# Patient Record
Sex: Female | Born: 1990
Health system: Southern US, Community
[De-identification: ages and names within clinical notes are randomized; demographics above are authoritative.]

## PROBLEM LIST (undated history)

## (undated) DIAGNOSIS — O039 Complete or unspecified spontaneous abortion without complication: Secondary | ICD-10-CM

## (undated) DIAGNOSIS — N12 Tubulo-interstitial nephritis, not specified as acute or chronic: Secondary | ICD-10-CM

## (undated) DIAGNOSIS — F329 Major depressive disorder, single episode, unspecified: Secondary | ICD-10-CM

## (undated) DIAGNOSIS — F32A Depression, unspecified: Secondary | ICD-10-CM

## (undated) DIAGNOSIS — F909 Attention-deficit hyperactivity disorder, unspecified type: Secondary | ICD-10-CM

## (undated) DIAGNOSIS — F419 Anxiety disorder, unspecified: Secondary | ICD-10-CM

## (undated) HISTORY — PX: CHOLECYSTECTOMY: SHX55

## (undated) HISTORY — PX: WISDOM TOOTH EXTRACTION: SHX21

## (undated) HISTORY — PX: OVARIAN CYST SURGERY: SHX726

## (undated) HISTORY — PX: KNEE ARTHROSCOPY: SUR90

---

## 2010-10-01 ENCOUNTER — Emergency Department (HOSPITAL_BASED_OUTPATIENT_CLINIC_OR_DEPARTMENT_OTHER)
Admission: EM | Admit: 2010-10-01 | Discharge: 2010-10-01 | Disposition: A | Discharge status: Home / Self Care | Payer: Managed Care, Other (non HMO) | Attending: Emergency Medicine | Admitting: Emergency Medicine

## 2010-10-01 DIAGNOSIS — N390 Urinary tract infection, site not specified: Secondary | ICD-10-CM | POA: Insufficient documentation

## 2010-10-01 LAB — URINE MICROSCOPIC-ADD ON

## 2010-10-01 LAB — URINALYSIS, ROUTINE W REFLEX MICROSCOPIC
Hgb urine dipstick: NEGATIVE
Specific Gravity, Urine: 1.015 (ref 1.005–1.030)
Urobilinogen, UA: 4 mg/dL — ABNORMAL HIGH (ref 0.0–1.0)

## 2011-06-03 ENCOUNTER — Emergency Department (INDEPENDENT_AMBULATORY_CARE_PROVIDER_SITE_OTHER): Payer: Managed Care, Other (non HMO)

## 2011-06-03 ENCOUNTER — Encounter: Payer: Self-pay | Admitting: *Deleted

## 2011-06-03 ENCOUNTER — Emergency Department (HOSPITAL_BASED_OUTPATIENT_CLINIC_OR_DEPARTMENT_OTHER)
Admission: EM | Admit: 2011-06-03 | Discharge: 2011-06-04 | Disposition: A | Discharge status: Home / Self Care | Payer: Managed Care, Other (non HMO) | Attending: Emergency Medicine | Admitting: Emergency Medicine

## 2011-06-03 DIAGNOSIS — R05 Cough: Secondary | ICD-10-CM

## 2011-06-03 DIAGNOSIS — R0602 Shortness of breath: Secondary | ICD-10-CM

## 2011-06-03 DIAGNOSIS — R1110 Vomiting, unspecified: Secondary | ICD-10-CM

## 2011-06-03 DIAGNOSIS — R109 Unspecified abdominal pain: Secondary | ICD-10-CM

## 2011-06-03 DIAGNOSIS — R112 Nausea with vomiting, unspecified: Secondary | ICD-10-CM | POA: Insufficient documentation

## 2011-06-03 DIAGNOSIS — R1031 Right lower quadrant pain: Secondary | ICD-10-CM

## 2011-06-03 DIAGNOSIS — R1012 Left upper quadrant pain: Secondary | ICD-10-CM | POA: Insufficient documentation

## 2011-06-03 DIAGNOSIS — E860 Dehydration: Secondary | ICD-10-CM | POA: Insufficient documentation

## 2011-06-03 DIAGNOSIS — N83209 Unspecified ovarian cyst, unspecified side: Secondary | ICD-10-CM

## 2011-06-03 DIAGNOSIS — D72829 Elevated white blood cell count, unspecified: Secondary | ICD-10-CM

## 2011-06-03 DIAGNOSIS — R509 Fever, unspecified: Secondary | ICD-10-CM

## 2011-06-03 HISTORY — DX: Attention-deficit hyperactivity disorder, unspecified type: F90.9

## 2011-06-03 LAB — CBC
MCH: 29 pg (ref 26.0–34.0)
MCHC: 34.1 g/dL (ref 30.0–36.0)
Platelets: 289 10*3/uL (ref 150–400)
RBC: 5 MIL/uL (ref 3.87–5.11)
RDW: 12.7 % (ref 11.5–15.5)

## 2011-06-03 LAB — URINALYSIS, ROUTINE W REFLEX MICROSCOPIC
Ketones, ur: 15 mg/dL — AB
Nitrite: NEGATIVE
Protein, ur: NEGATIVE mg/dL
Urobilinogen, UA: 0.2 mg/dL (ref 0.0–1.0)
pH: 7.5 (ref 5.0–8.0)

## 2011-06-03 LAB — COMPREHENSIVE METABOLIC PANEL
ALT: 21 U/L (ref 0–35)
Albumin: 4.5 g/dL (ref 3.5–5.2)
Alkaline Phosphatase: 67 U/L (ref 39–117)
Calcium: 9.8 mg/dL (ref 8.4–10.5)
Potassium: 3.2 mEq/L — ABNORMAL LOW (ref 3.5–5.1)
Sodium: 139 mEq/L (ref 135–145)
Total Protein: 7.6 g/dL (ref 6.0–8.3)

## 2011-06-03 LAB — DIFFERENTIAL
Basophils Absolute: 0 10*3/uL (ref 0.0–0.1)
Basophils Relative: 0 % (ref 0–1)
Eosinophils Absolute: 0 10*3/uL (ref 0.0–0.7)
Neutrophils Relative %: 93 % — ABNORMAL HIGH (ref 43–77)

## 2011-06-03 LAB — URINE MICROSCOPIC-ADD ON

## 2011-06-03 MED ORDER — ONDANSETRON HCL 4 MG/2ML IJ SOLN
4.0000 mg | Freq: Once | INTRAMUSCULAR | Status: AC
  Administered 2011-06-03: 4 mg via INTRAVENOUS
  Filled 2011-06-03: qty 2

## 2011-06-03 MED ORDER — HYDROMORPHONE HCL PF 1 MG/ML IJ SOLN
0.5000 mg | Freq: Once | INTRAMUSCULAR | Status: AC
  Administered 2011-06-03: 0.5 mg via INTRAVENOUS
  Filled 2011-06-03: qty 1

## 2011-06-03 MED ORDER — SODIUM CHLORIDE 0.9 % IV BOLUS (SEPSIS)
1000.0000 mL | Freq: Once | INTRAVENOUS | Status: AC
  Administered 2011-06-03: 1000 mL via INTRAVENOUS

## 2011-06-03 MED ORDER — ACETAMINOPHEN 500 MG PO TABS
ORAL_TABLET | ORAL | Status: AC
  Administered 2011-06-03: 1000 mg via ORAL
  Filled 2011-06-03: qty 2

## 2011-06-03 MED ORDER — ACETAMINOPHEN 500 MG PO TABS
1000.0000 mg | ORAL_TABLET | Freq: Once | ORAL | Status: AC
  Administered 2011-06-03: 1000 mg via ORAL

## 2011-06-03 MED ORDER — IOHEXOL 300 MG/ML  SOLN
100.0000 mL | Freq: Once | INTRAMUSCULAR | Status: AC | PRN
  Administered 2011-06-03: 100 mL via INTRAVENOUS

## 2011-06-03 NOTE — ED Provider Notes (Signed)
History     CSN: [id number] Arrival date & time: 06/03/2011  5:17 PM   First MD Initiated Contact with Patient 06/03/11 1741      Chief Complaint  Patient presents with  . Abdominal Pain    (Consider location/radiation/quality/duration/timing/severity/associated sxs/prior treatment) Patient is a 20 y.o. female presenting with abdominal pain. The history is provided by the patient.  Abdominal Pain The primary symptoms of the illness include abdominal pain, nausea and vomiting. The primary symptoms of the illness do not include fever, diarrhea or dysuria. The onset of the illness was gradual.  The abdominal pain radiates to the LUQ. The abdominal pain is relieved by nothing.  Symptoms associated with the illness do not include chills.    Past Medical History  Diagnosis Date  . ADHD (attention deficit hyperactivity disorder)     Past Surgical History  Procedure Date  . Cholecystectomy   . Wisdom tooth extraction   . Ovarian cyst surgery     History reviewed. No pertinent family history.  History  Substance Use Topics  . Smoking status: Never Smoker   . Smokeless tobacco: Not on file  . Alcohol Use: Yes    OB History    Grav Para Term Preterm Abortions TAB SAB Ect Mult Living                  Review of Systems  Constitutional: Negative for fever and chills.  HENT: Negative.   Respiratory: Negative.  Negative for cough.   Cardiovascular: Negative.  Negative for chest pain.  Gastrointestinal: Positive for nausea, vomiting and abdominal pain. Negative for diarrhea.  Genitourinary: Negative for dysuria.  Musculoskeletal: Negative.   Skin: Negative.   Neurological: Negative.     Allergies  Morphine and related and Strawberry  Home Medications   Current Outpatient Rx  Name Route Sig Dispense Refill  . AMPHETAMINE-DEXTROAMPHETAMINE 30 MG PO CP24 Oral Take 30 mg by mouth every morning.      Marland Kitchen PROMETHAZINE HCL 25 MG PO TABS Oral Take 25 mg by mouth every 6  (six) hours as needed. For nausea      . VENLAFAXINE HCL 37.5 MG PO TABS Oral Take 37.5 mg by mouth daily.      Marland Kitchen HYDROCODONE-ACETAMINOPHEN 5-325 MG PO TABS Oral Take 1 tablet by mouth every 4 (four) hours as needed for pain. 6 tablet 0  . ONDANSETRON HCL 4 MG PO TABS Oral Take 1 tablet (4 mg total) by mouth every 6 (six) hours. 12 tablet 0    BP 102/31  Pulse 104  Temp(Src) 98.1 F (36.7 C) (Oral)  Resp 18  Ht 5\' 1"  (1.549 m)  Wt 145 lb (65.772 kg)  BMI 27.40 kg/m2  SpO2 97%  LMP 05/18/2011  Physical Exam  Constitutional: She appears well-developed and well-nourished.  HENT:  Head: Normocephalic.  Neck: Normal range of motion. Neck supple.  Cardiovascular: Normal rate and regular rhythm.   Pulmonary/Chest: Effort normal and breath sounds normal.  Abdominal: Soft. Bowel sounds are normal. There is no rebound and no guarding.    Musculoskeletal: Normal range of motion.  Neurological: She is alert. No cranial nerve deficit.  Skin: Skin is warm and dry. No rash noted.  Psychiatric: She has a normal mood and affect.    ED Course  Procedures (including critical care time)  Labs Reviewed  URINALYSIS, ROUTINE W REFLEX MICROSCOPIC - Abnormal; Notable for the following:    Ketones, ur 15 (*)    Leukocytes, UA TRACE (*)  All other components within normal limits  CBC - Abnormal; Notable for the following:    WBC 17.7 (*)    All other components within normal limits  DIFFERENTIAL - Abnormal; Notable for the following:    Neutrophils Relative 93 (*)    Neutro Abs 16.4 (*)    Lymphocytes Relative 3 (*)    Lymphs Abs 0.5 (*)    All other components within normal limits  COMPREHENSIVE METABOLIC PANEL - Abnormal; Notable for the following:    Potassium 3.2 (*)    Glucose, Bld 100 (*)    All other components within normal limits  URINE MICROSCOPIC-ADD ON - Abnormal; Notable for the following:    Squamous Epithelial / LPF FEW (*)    Bacteria, UA MANY (*)    All other  components within normal limits  PREGNANCY, URINE  LIPASE, BLOOD  MONONUCLEOSIS SCREEN   Results for orders placed during the hospital encounter of 06/03/11  URINALYSIS, ROUTINE W REFLEX MICROSCOPIC      Component Value Range   Color, Urine YELLOW  YELLOW    Appearance CLEAR  CLEAR    Specific Gravity, Urine 1.016  1.005 - 1.030    pH 7.5  5.0 - 8.0    Glucose, UA NEGATIVE  NEGATIVE (mg/dL)   Hgb urine dipstick NEGATIVE  NEGATIVE    Bilirubin Urine NEGATIVE  NEGATIVE    Ketones, ur 15 (*) NEGATIVE (mg/dL)   Protein, ur NEGATIVE  NEGATIVE (mg/dL)   Urobilinogen, UA 0.2  0.0 - 1.0 (mg/dL)   Nitrite NEGATIVE  NEGATIVE    Leukocytes, UA TRACE (*) NEGATIVE   PREGNANCY, URINE      Component Value Range   Preg Test, Ur NEGATIVE    CBC      Component Value Range   WBC 17.7 (*) 4.0 - 10.5 (K/uL)   RBC 5.00  3.87 - 5.11 (MIL/uL)   Hemoglobin 14.5  12.0 - 15.0 (g/dL)   HCT 96.0  45.4 - 09.8 (%)   MCV 85.0  78.0 - 100.0 (fL)   MCH 29.0  26.0 - 34.0 (pg)   MCHC 34.1  30.0 - 36.0 (g/dL)   RDW 11.9  14.7 - 82.9 (%)   Platelets 289  150 - 400 (K/uL)  DIFFERENTIAL      Component Value Range   Neutrophils Relative 93 (*) 43 - 77 (%)   Neutro Abs 16.4 (*) 1.7 - 7.7 (K/uL)   Lymphocytes Relative 3 (*) 12 - 46 (%)   Lymphs Abs 0.5 (*) 0.7 - 4.0 (K/uL)   Monocytes Relative 5  3 - 12 (%)   Monocytes Absolute 0.8  0.1 - 1.0 (K/uL)   Eosinophils Relative 0  0 - 5 (%)   Eosinophils Absolute 0.0  0.0 - 0.7 (K/uL)   Basophils Relative 0  0 - 1 (%)   Basophils Absolute 0.0  0.0 - 0.1 (K/uL)  COMPREHENSIVE METABOLIC PANEL      Component Value Range   Sodium 139  135 - 145 (mEq/L)   Potassium 3.2 (*) 3.5 - 5.1 (mEq/L)   Chloride 100  96 - 112 (mEq/L)   CO2 27  19 - 32 (mEq/L)   Glucose, Bld 100 (*) 70 - 99 (mg/dL)   BUN 10  6 - 23 (mg/dL)   Creatinine, Ser 5.62  0.50 - 1.10 (mg/dL)   Calcium 9.8  8.4 - 13.0 (mg/dL)   Total Protein 7.6  6.0 - 8.3 (g/dL)   Albumin 4.5  3.5 -  5.2 (g/dL)   AST  25  0 - 37 (U/L)   ALT 21  0 - 35 (U/L)   Alkaline Phosphatase 67  39 - 117 (U/L)   Total Bilirubin 0.4  0.3 - 1.2 (mg/dL)   GFR calc non Af Amer >90  >90 (mL/min)   GFR calc Af Amer >90  >90 (mL/min)  LIPASE, BLOOD      Component Value Range   Lipase 14  11 - 59 (U/L)  MONONUCLEOSIS SCREEN      Component Value Range   Mono Screen NEGATIVE  NEGATIVE   URINE MICROSCOPIC-ADD ON      Component Value Range   Squamous Epithelial / LPF FEW (*) RARE    WBC, UA 0-2  <3 (WBC/hpf)   Bacteria, UA MANY (*) RARE    Dg Chest 2 View  06/03/2011  *RADIOLOGY REPORT*  Clinical Data: Fever and cough and short of breath  CHEST - 2 VIEW  Comparison: None.  Findings: Heart size is normal and the vascularity is normal. Lungs are clear without pneumonia or effusion.  IMPRESSION: Negative  Original Report Authenticated By: Camelia Phenes, M.D.   Ct Abdomen Pelvis W Contrast  06/03/2011  *RADIOLOGY REPORT*  Clinical Data: Right lower quadrant pain.  Vomiting.  Leukocytosis.  CT ABDOMEN AND PELVIS WITH CONTRAST  Technique:  Multidetector CT imaging of the abdomen and pelvis was performed following the standard protocol during bolus administration of intravenous contrast.  Contrast: OMNIPAQUE IOHEXOL 300 MG/ML IV SOLN  Comparison: None.  Findings: Surgical clips seen from prior cholecystectomy.  The abdominal parenchymal organs are normal in appearance.  No evidence of hydronephrosis.  A simple appearing right ovarian cyst is seen measuring 3.2 x 2.6 cm.  Uterus and left adnexa are unremarkable.  A small amount of free fluid is seen in the pelvic cul-de-sac, which is likely physiologic.  Normal appendix is visualized.  No evidence of inflammatory process or dilated bowel loops.  IMPRESSION:  1.  3.2 cm right ovarian cyst and small amount of free fluid, most likely physiologic in a reproductive age female. 2.  No evidence of appendicitis.  Original Report Authenticated By: Danae Orleans, M.D.       1.  Abdominal  pain, other specified site   2. Dehydration       MDM  Re-eval:  Patient having pain again. No further vomiting, nausea controlled. She reports recent exposure to Mono with ST on initial illness. Cough as well in the beginning of symptoms. Will evaluate further and continue to observe.  22:30:  Patient continues to be tachycardic. Orthostatics checked and positive. Will continue to bolus another liter of fluids and observe.  00:05:  Patient's vital signs improved, no dizziness with standing. Walking to bathroom without difficulty. Feel she can be discharged home.    Medical screening examination/treatment/procedure(s) were performed by non-physician practitioner and as supervising physician I was immediately available for consultation/collaboration. Carleene Cooper III,M.D.     Rodena Medin, PA 06/04/11 0007  Carleene Cooper III, MD 06/04/11 [phone]

## 2011-06-03 NOTE — ED Notes (Signed)
Pt has called out requesting another dose of pain medication, primary rn and edp notified.

## 2011-06-03 NOTE — ED Notes (Signed)
Pt states that she has had abd pain and vomiting since last pm. Describes as cramping. Taking Phenergan without relief. Friend had mono.

## 2011-06-04 MED ORDER — KETOROLAC TROMETHAMINE 30 MG/ML IJ SOLN
30.0000 mg | Freq: Once | INTRAMUSCULAR | Status: AC
  Administered 2011-06-04: 30 mg via INTRAVENOUS
  Filled 2011-06-04: qty 1

## 2011-06-04 MED ORDER — ONDANSETRON HCL 4 MG PO TABS: 4.0000 mg | ORAL_TABLET | Freq: Four times a day (QID) | ORAL | Status: AC

## 2011-06-04 MED ORDER — HYDROCODONE-ACETAMINOPHEN 5-325 MG PO TABS: 1.0000 | ORAL_TABLET | ORAL | Status: AC | PRN

## 2011-09-04 ENCOUNTER — Encounter (HOSPITAL_BASED_OUTPATIENT_CLINIC_OR_DEPARTMENT_OTHER): Payer: Self-pay

## 2011-09-04 ENCOUNTER — Emergency Department (HOSPITAL_BASED_OUTPATIENT_CLINIC_OR_DEPARTMENT_OTHER)
Admission: EM | Admit: 2011-09-04 | Discharge: 2011-09-04 | Disposition: A | Discharge status: Home / Self Care | Payer: Managed Care, Other (non HMO) | Attending: Emergency Medicine | Admitting: Emergency Medicine

## 2011-09-04 DIAGNOSIS — N12 Tubulo-interstitial nephritis, not specified as acute or chronic: Secondary | ICD-10-CM

## 2011-09-04 DIAGNOSIS — F909 Attention-deficit hyperactivity disorder, unspecified type: Secondary | ICD-10-CM | POA: Insufficient documentation

## 2011-09-04 DIAGNOSIS — R350 Frequency of micturition: Secondary | ICD-10-CM | POA: Insufficient documentation

## 2011-09-04 HISTORY — DX: Tubulo-interstitial nephritis, not specified as acute or chronic: N12

## 2011-09-04 LAB — URINALYSIS, ROUTINE W REFLEX MICROSCOPIC
Bilirubin Urine: NEGATIVE
Glucose, UA: NEGATIVE mg/dL
Specific Gravity, Urine: 1.012 (ref 1.005–1.030)
pH: 6 (ref 5.0–8.0)

## 2011-09-04 LAB — CBC
HCT: 42.8 % (ref 36.0–46.0)
Hemoglobin: 14.3 g/dL (ref 12.0–15.0)
MCV: 87.9 fL (ref 78.0–100.0)
RBC: 4.87 MIL/uL (ref 3.87–5.11)
RDW: 13.6 % (ref 11.5–15.5)
WBC: 7.7 10*3/uL (ref 4.0–10.5)

## 2011-09-04 LAB — URINE MICROSCOPIC-ADD ON

## 2011-09-04 LAB — BASIC METABOLIC PANEL
CO2: 26 mEq/L (ref 19–32)
Chloride: 102 mEq/L (ref 96–112)
Creatinine, Ser: 0.7 mg/dL (ref 0.50–1.10)
Glucose, Bld: 100 mg/dL — ABNORMAL HIGH (ref 70–99)

## 2011-09-04 LAB — DIFFERENTIAL
Basophils Absolute: 0 10*3/uL (ref 0.0–0.1)
Lymphocytes Relative: 23 % (ref 12–46)
Lymphs Abs: 1.8 10*3/uL (ref 0.7–4.0)
Monocytes Absolute: 0.6 10*3/uL (ref 0.1–1.0)
Neutro Abs: 5.2 10*3/uL (ref 1.7–7.7)

## 2011-09-04 LAB — PREGNANCY, URINE: Preg Test, Ur: NEGATIVE

## 2011-09-04 MED ORDER — DEXTROSE 5 % IV SOLN
1.0000 g | Freq: Once | INTRAVENOUS | Status: AC
  Administered 2011-09-04: 1 g via INTRAVENOUS
  Filled 2011-09-04: qty 10

## 2011-09-04 MED ORDER — NITROFURANTOIN MONOHYD MACRO 100 MG PO CAPS: 100.0000 mg | ORAL_CAPSULE | Freq: Two times a day (BID) | ORAL | Status: DC

## 2011-09-04 MED ORDER — OXYCODONE-ACETAMINOPHEN 5-325 MG PO TABS: 2.0000 | ORAL_TABLET | ORAL | Status: DC | PRN

## 2011-09-04 MED ORDER — SODIUM CHLORIDE 0.9 % IV SOLN
Freq: Once | INTRAVENOUS | Status: AC
  Administered 2011-09-04: 11:00:00 via INTRAVENOUS

## 2011-09-04 NOTE — ED Notes (Signed)
Pt c/o bilateral flank pain onset 2 weeks ago.  Pt c/o dysuria, increased frequency, denies hematuria.  Pt states she has HX of same.

## 2011-09-04 NOTE — ED Provider Notes (Signed)
History     CSN: [id number]  Arrival date & time 09/04/11  0944   First MD Initiated Contact with Patient 09/04/11 [phone]      Chief Complaint  Patient presents with  . Flank Pain    (Consider location/radiation/quality/duration/timing/severity/associated sxs/prior treatment) Patient is a 21 y.o. female presenting with flank pain. The history is provided by the patient. No language interpreter was used.  Flank Pain This is a new problem. The current episode started 1 to 4 weeks ago. The problem occurs daily. The problem has been gradually worsening. Associated symptoms include urinary symptoms. Pertinent negatives include no fever, nausea, vomiting or weakness. The symptoms are aggravated by twisting and coughing. She has tried nothing for the symptoms.   patient reports bilateral flank pain today x5 days gradually getting worse. States that she's had earning with urination x2 weeks. She's had pyelonephritis in the past and states that this feels like. He denies fever hematuria pregnancy nausea vomiting or diarrhea. States that she does have a primary care provider at [facility] that she cannot get in there today. She is sexually active and uses another range for birth control but she states that she is inconsistent with that. Menstrual period was February 3 and was on time and regular. Past medical history of an ovarian cyst cholecystectomy and appendix removed. She is on Effexor and Adderall ADHD.  Past Medical History  Diagnosis Date  . ADHD (attention deficit hyperactivity disorder)   . Pyelonephritis     Past Surgical History  Procedure Date  . Cholecystectomy   . Wisdom tooth extraction   . Ovarian cyst surgery   . Appendectomy   . Knee arthroscopy     No family history on file.  History  Substance Use Topics  . Smoking status: Never Smoker   . Smokeless tobacco: Not on file  . Alcohol Use: Yes    OB History    Grav Para Term Preterm Abortions TAB SAB Ect Mult Living                   Review of Systems  Constitutional: Negative for fever.  Gastrointestinal: Negative for nausea and vomiting.  Genitourinary: Positive for urgency, frequency and flank pain. Negative for hematuria, vaginal bleeding, vaginal discharge and vaginal pain.  Neurological: Negative for dizziness, weakness and light-headedness.  All other systems reviewed and are negative.    Allergies  Morphine and related and Strawberry  Home Medications   Current Outpatient Rx  Name Route Sig Dispense Refill  . AMPHETAMINE-DEXTROAMPHET ER 30 MG PO CP24 Oral Take 30 mg by mouth every morning.      Marland Kitchen PROMETHAZINE HCL 25 MG PO TABS Oral Take 25 mg by mouth every 6 (six) hours as needed. For nausea      . VENLAFAXINE HCL 37.5 MG PO TABS Oral Take 37.5 mg by mouth daily.        BP 114/43  Pulse 74  Temp(Src) 98 F (36.7 C) (Oral)  Resp 16  Ht 5\' 1"  (1.549 m)  Wt 132 lb (59.875 kg)  BMI 24.94 kg/m2  SpO2 100%  LMP 08/20/2011  Physical Exam  Genitourinary:       Bilateral cva tenderness     ED Course  Procedures (including critical care time)   Labs Reviewed  URINALYSIS, ROUTINE W REFLEX MICROSCOPIC  PREGNANCY, URINE  CBC  DIFFERENTIAL   No results found.   No diagnosis found.    MDM   21 year old female with bilateral CVA  tenderness and a positive UTI past medical history of the same. Rocephin 1 g given in the ER. Patient was given a prescription for Macrobid and Percocet. She'll have a close followup with her PCP at [facility] of care. White count was normal afebrile no nausea vomiting.  Labs Reviewed  URINALYSIS, ROUTINE W REFLEX MICROSCOPIC - Abnormal; Notable for the following:    Color, Urine ORANGE (*) BIOCHEMICALS MAY BE AFFECTED BY COLOR   Hgb urine dipstick MODERATE (*)    Nitrite POSITIVE (*)    Leukocytes, UA TRACE (*)    All other components within normal limits  BASIC METABOLIC PANEL - Abnormal; Notable for the following:    Glucose, Bld 100  (*)    All other components within normal limits  URINE MICROSCOPIC-ADD ON - Abnormal; Notable for the following:    Bacteria, UA MANY (*)    All other components within normal limits  PREGNANCY, URINE  CBC  DIFFERENTIAL           Jethro Bastos, NP 09/04/11 1148

## 2011-09-04 NOTE — ED Provider Notes (Signed)
History/physical exam/procedure(s) were performed by non-physician practitioner and as supervising physician I was immediately available for consultation/collaboration. I have reviewed all notes and am in agreement with care and plan.   [name], MD 09/04/11 [phone]

## 2011-09-04 NOTE — Discharge Instructions (Signed)
Amy Lutz we gave you a dose of IV Rocephin in the ER today for possible pyelonephritis. Your CBC did not show an increase in your white blood cell which is good. You need to followup with [facility] within the next week to have your urine rechecked. Return to the ER for uncontrolled pain fever nausea and vomiting. Start antibiotics as soon as possible. Take Percocet for pain as needed but do not drive with this medication. You can also take ibuprofen for pain during the day while you're driving.   Pyelonephritis, Adult Pyelonephritis is a kidney infection. A kidney infection can happen quickly, or it can last for a long time. HOME CARE   Take your medicine (antibiotics) as told. Finish it even if you start to feel better.   Keep all doctor visits as told.   Drink enough fluids to keep your pee (urine) clear or pale yellow.   Only take medicine as told by your doctor.  GET HELP RIGHT AWAY IF:   You have a fever.   You cannot take your medicine or drink fluids as told.   You have chills and shaking.   You feel very weak or pass out (faint).   You do not feel better after 2 days.  MAKE SURE YOU:  Understand these instructions.   Will watch your condition.   Will get help right away if you are not doing well or get worse.  Document Released: 08/10/2004 Document Revised: 03/15/2011 Document Reviewed: 12/21/2010 [facility] Patient Information 2012 [place], [place].Urinary Tract Infection A urinary tract infection (UTI) is often caused by a germ (bacteria). A UTI is usually helped with medicine (antibiotics) that kills germs. Take all the medicine until it is gone. Do this even if you are feeling better. You are usually better in 7 to 10 days. HOME CARE  Drink enough water and fluids to keep your pee (urine) clear or pale yellow. Drink:  Cranberry juice.  Water.  Avoid:  Caffeine.  Tea.  Bubbly (carbonated) drinks.  Alcohol.  Only take medicine as told by your doctor.  To prevent  further infections:  Pee often.  After pooping (bowel movement), women should wipe from front to back. Use each tissue only once.  Pee before and after having sex (intercourse).  Ask your doctor when your test results will be ready. Make sure you follow up and get your test results.  GET HELP RIGHT AWAY IF:  There is very bad back pain or lower belly (abdominal) pain.  You get the chills.  You have a fever.  Your baby is older than 3 months with a rectal temperature of 102 F (38.9 C) or higher.  Your baby is 18 months old or younger with a rectal temperature of 100.4 F (38 C) or higher.  You feel sick to your stomach (nauseous) or throw up (vomit).  There is continued burning with peeing.  Your problems are not better in 3 days. Return sooner if you are getting worse.  MAKE SURE YOU:  Understand these instructions.  Will watch your condition.  Will get help right away if you are not doing well or get worse.  Document Released: 12/20/2007 Document Revised: 03/15/2011 Document Reviewed: 12/20/2007 [facility] Patient Information 2012 [place], [place].

## 2011-09-11 ENCOUNTER — Encounter (HOSPITAL_BASED_OUTPATIENT_CLINIC_OR_DEPARTMENT_OTHER): Payer: Self-pay

## 2011-09-11 ENCOUNTER — Emergency Department (HOSPITAL_BASED_OUTPATIENT_CLINIC_OR_DEPARTMENT_OTHER)
Admission: EM | Admit: 2011-09-11 | Discharge: 2011-09-11 | Disposition: A | Discharge status: Home / Self Care | Payer: Managed Care, Other (non HMO) | Attending: Emergency Medicine | Admitting: Emergency Medicine

## 2011-09-11 DIAGNOSIS — R1031 Right lower quadrant pain: Secondary | ICD-10-CM | POA: Insufficient documentation

## 2011-09-11 DIAGNOSIS — R1032 Left lower quadrant pain: Secondary | ICD-10-CM | POA: Insufficient documentation

## 2011-09-11 DIAGNOSIS — N390 Urinary tract infection, site not specified: Secondary | ICD-10-CM | POA: Insufficient documentation

## 2011-09-11 LAB — URINALYSIS, ROUTINE W REFLEX MICROSCOPIC
Bilirubin Urine: NEGATIVE
Ketones, ur: NEGATIVE mg/dL
Nitrite: NEGATIVE
Specific Gravity, Urine: 1.018 (ref 1.005–1.030)
Urobilinogen, UA: 1 mg/dL (ref 0.0–1.0)

## 2011-09-11 LAB — PREGNANCY, URINE: Preg Test, Ur: NEGATIVE

## 2011-09-11 MED ORDER — CIPROFLOXACIN HCL 500 MG PO TABS: 500.0000 mg | ORAL_TABLET | Freq: Two times a day (BID) | ORAL | Status: AC

## 2011-09-11 NOTE — Discharge Instructions (Signed)

## 2011-09-11 NOTE — ED Provider Notes (Signed)
History  Scribed for Amy Quarry, MD, the patient was seen in MH03/MH03. The chart was scribed by Gilman Schmidt. The patients care was started at 6:26 PM.   CSN: [id number]  Arrival date & time 09/11/11  1757   First MD Initiated Contact with Patient 09/11/11 1815      Chief Complaint  Patient presents with  . Urinary Frequency  . Urinary Tract Infection    (Consider location/radiation/quality/duration/timing/severity/associated sxs/prior treatment) HPI Amy Lutz is a 21 y.o. female with a history of ADHD and Pyelonephritis who presents to the Emergency Department complaining of urinary frequency and urinary tract infection. Pt reports being seen in ED last week and taken Macrobid with no relief. Notes symptoms initially improved and then returned. Pt also notes chills and back pain. Denies any vaginal pain or pain with intercourse. Denies use of birth control or hx or STD. Denies any nausuea, vomiting, cough, or sore throat. There are no other associated symptoms and no other alleviating or aggravating factors.    PCP: [facility]  Past Medical History  Diagnosis Date  . ADHD (attention deficit hyperactivity disorder)   . Pyelonephritis     Past Surgical History  Procedure Date  . Cholecystectomy   . Wisdom tooth extraction   . Ovarian cyst surgery   . Appendectomy   . Knee arthroscopy     No family history on file.  History  Substance Use Topics  . Smoking status: Never Smoker   . Smokeless tobacco: Not on file  . Alcohol Use: Yes    OB History    Grav Para Term Preterm Abortions TAB SAB Ect Mult Living                  Review of Systems  Constitutional: Positive for chills.  HENT: Negative for sore throat.   Respiratory: Positive for cough.   Gastrointestinal: Negative for nausea and vomiting.  Genitourinary: Positive for dysuria and frequency. Negative for vaginal discharge.  Musculoskeletal: Positive for back pain.     Allergies  Morphine and related and Strawberry  Home Medications   Current Outpatient Rx  Name Route Sig Dispense Refill  . AMPHETAMINE-DEXTROAMPHET ER 30 MG PO CP24 Oral Take 30 mg by mouth every morning.      . VENLAFAXINE HCL 37.5 MG PO TABS Oral Take 37.5 mg by mouth daily.        BP 108/54  Pulse 87  Temp(Src) 98.3 F (36.8 C) (Oral)  Resp 16  Ht 5\' 1"  (1.549 m)  Wt 133 lb (60.328 kg)  BMI 25.13 kg/m2  SpO2 100%  LMP 08/20/2011  Physical Exam  Constitutional: She appears well-developed and well-nourished.  HENT:  Head: Normocephalic and atraumatic.  Eyes: Conjunctivae are normal. Pupils are equal, round, and reactive to light.  Neck: Neck supple. No tracheal deviation present. No thyromegaly present.  Cardiovascular: Normal rate and regular rhythm.   No murmur heard. Pulmonary/Chest: Effort normal and breath sounds normal.  Abdominal: Soft. Bowel sounds are normal. She exhibits no distension. There is tenderness in the right lower quadrant and left lower quadrant.  Musculoskeletal: Normal range of motion. She exhibits no edema and no tenderness.  Neurological: She is alert. Coordination normal.  Skin: Skin is warm and dry. No rash noted.  Psychiatric: She has a normal mood and affect.    ED Course  Procedures (including critical care time)   Labs Reviewed  PREGNANCY, URINE  URINALYSIS, ROUTINE W REFLEX MICROSCOPIC   No results  found.   No diagnosis found.  DIAGNOSTIC STUDIES: Oxygen Saturation is 100% on room air, normal by my interpretation.    LABS Results for orders placed during the hospital encounter of 09/11/11  PREGNANCY, URINE      Component Value Range   Preg Test, Ur NEGATIVE  NEGATIVE   URINALYSIS, ROUTINE W REFLEX MICROSCOPIC      Component Value Range   Color, Urine YELLOW  YELLOW    APPearance CLOUDY (*) CLEAR    Specific Gravity, Urine 1.018  1.005 - 1.030    pH 6.5  5.0 - 8.0    Glucose, UA NEGATIVE  NEGATIVE (mg/dL)   Hgb  urine dipstick NEGATIVE  NEGATIVE    Bilirubin Urine NEGATIVE  NEGATIVE    Ketones, ur NEGATIVE  NEGATIVE (mg/dL)   Protein, ur NEGATIVE  NEGATIVE (mg/dL)   Urobilinogen, UA 1.0  0.0 - 1.0 (mg/dL)   Nitrite NEGATIVE  NEGATIVE    Leukocytes, UA MODERATE (*) NEGATIVE   URINE MICROSCOPIC-ADD ON      Component Value Range   Squamous Epithelial / LPF FEW (*) RARE    WBC, UA 7-10  <3 (WBC/hpf)   RBC / HPF 0-2  <3 (RBC/hpf)   Bacteria, UA MANY (*) RARE     COORDINATION OF CARE: 6:26pm:  - Patient evaluated by ED physician, Pregnancy urine, UA ordered   Patient continues with mild pyuria.  Cipro prescribed and patient to follow up with pmd.  MDM  I personally performed the services described in this documentation, which was scribed in my presence. The recorded information has been reviewed and considered.   Amy Quarry, MD 09/11/11 [phone]

## 2011-09-11 NOTE — ED Notes (Signed)
Report given to [facility] Sam RN, and Press photographer

## 2011-09-11 NOTE — ED Notes (Signed)
Pt reports low back pain, urinary symptoms and no relief after taking Macrobid.  She was seen here last week.

## 2011-11-01 ENCOUNTER — Encounter (HOSPITAL_BASED_OUTPATIENT_CLINIC_OR_DEPARTMENT_OTHER): Payer: Self-pay | Admitting: *Deleted

## 2011-11-01 ENCOUNTER — Emergency Department (INDEPENDENT_AMBULATORY_CARE_PROVIDER_SITE_OTHER): Payer: Managed Care, Other (non HMO)

## 2011-11-01 ENCOUNTER — Emergency Department (HOSPITAL_BASED_OUTPATIENT_CLINIC_OR_DEPARTMENT_OTHER)
Admission: EM | Admit: 2011-11-01 | Discharge: 2011-11-01 | Disposition: A | Discharge status: Home Health | Payer: Managed Care, Other (non HMO) | Attending: Emergency Medicine | Admitting: Emergency Medicine

## 2011-11-01 DIAGNOSIS — N83201 Unspecified ovarian cyst, right side: Secondary | ICD-10-CM

## 2011-11-01 DIAGNOSIS — N83209 Unspecified ovarian cyst, unspecified side: Secondary | ICD-10-CM | POA: Insufficient documentation

## 2011-11-01 DIAGNOSIS — R110 Nausea: Secondary | ICD-10-CM

## 2011-11-01 DIAGNOSIS — Z79899 Other long term (current) drug therapy: Secondary | ICD-10-CM | POA: Insufficient documentation

## 2011-11-01 DIAGNOSIS — R109 Unspecified abdominal pain: Secondary | ICD-10-CM

## 2011-11-01 DIAGNOSIS — R1013 Epigastric pain: Secondary | ICD-10-CM | POA: Insufficient documentation

## 2011-11-01 DIAGNOSIS — N390 Urinary tract infection, site not specified: Secondary | ICD-10-CM | POA: Insufficient documentation

## 2011-11-01 LAB — BASIC METABOLIC PANEL
Calcium: 9.7 mg/dL (ref 8.4–10.5)
GFR calc Af Amer: >90 mL/min (ref >90)
GFR calc non Af Amer: >90 mL/min (ref >90)
Glucose, Bld: 97 mg/dL (ref 70–99)
Potassium: 4.1 mEq/L (ref 3.5–5.1)
Sodium: 138 mEq/L (ref 135–145)

## 2011-11-01 LAB — URINALYSIS, ROUTINE W REFLEX MICROSCOPIC
Glucose, UA: NEGATIVE mg/dL
Hgb urine dipstick: NEGATIVE
Nitrite: NEGATIVE
Protein, ur: NEGATIVE mg/dL
Specific Gravity, Urine: 1.017 (ref 1.005–1.030)
Urobilinogen, UA: 0.2 mg/dL (ref 0.0–1.0)

## 2011-11-01 LAB — HEPATIC FUNCTION PANEL
ALT: 18 U/L (ref 0–35)
Bilirubin, Direct: <0.1 mg/dL (ref 0.0–0.3)
Total Protein: 7.5 g/dL (ref 6.0–8.3)

## 2011-11-01 LAB — CBC
MCH: 29.5 pg (ref 26.0–34.0)
MCHC: 34.3 g/dL (ref 30.0–36.0)
Platelets: 286 10*3/uL (ref 150–400)
RDW: 13.1 % (ref 11.5–15.5)

## 2011-11-01 LAB — URINE MICROSCOPIC-ADD ON

## 2011-11-01 LAB — LIPASE, BLOOD: Lipase: 21 U/L (ref 11–59)

## 2011-11-01 MED ORDER — SODIUM CHLORIDE 0.9 % IV BOLUS (SEPSIS)
1000.0000 mL | Freq: Once | INTRAVENOUS | Status: AC
  Administered 2011-11-01: 1000 mL via INTRAVENOUS

## 2011-11-01 MED ORDER — HYDROMORPHONE HCL PF 1 MG/ML IJ SOLN
1.0000 mg | Freq: Once | INTRAMUSCULAR | Status: AC
  Administered 2011-11-01: 1 mg via INTRAVENOUS
  Filled 2011-11-01: qty 1

## 2011-11-01 MED ORDER — ONDANSETRON HCL 4 MG/2ML IJ SOLN
4.0000 mg | Freq: Once | INTRAMUSCULAR | Status: AC
  Administered 2011-11-01: 4 mg via INTRAVENOUS
  Filled 2011-11-01: qty 2

## 2011-11-01 MED ORDER — ONDANSETRON HCL 4 MG/2ML IJ SOLN
INTRAMUSCULAR | Status: AC
  Administered 2011-11-01: 4 mg via INTRAVENOUS
  Filled 2011-11-01: qty 2

## 2011-11-01 MED ORDER — FAMOTIDINE 20 MG PO TABS: 20.0000 mg | ORAL_TABLET | Freq: Two times a day (BID) | ORAL | Status: DC

## 2011-11-01 MED ORDER — SODIUM CHLORIDE 0.9 % IV SOLN: INTRAVENOUS | Status: DC

## 2011-11-01 MED ORDER — IOHEXOL 300 MG/ML  SOLN: 20.0000 mL | INTRAMUSCULAR | Status: AC

## 2011-11-01 MED ORDER — IOHEXOL 300 MG/ML  SOLN
100.0000 mL | Freq: Once | INTRAMUSCULAR | Status: AC | PRN
  Administered 2011-11-01: 100 mL via INTRAVENOUS

## 2011-11-01 MED ORDER — CEPHALEXIN 500 MG PO CAPS: 500.0000 mg | ORAL_CAPSULE | Freq: Four times a day (QID) | ORAL | Status: DC

## 2011-11-01 MED ORDER — HYDROCODONE-ACETAMINOPHEN 5-325 MG PO TABS: 1.0000 | ORAL_TABLET | Freq: Four times a day (QID) | ORAL | Status: DC | PRN

## 2011-11-01 MED ORDER — ONDANSETRON HCL 4 MG/2ML IJ SOLN
4.0000 mg | Freq: Once | INTRAMUSCULAR | Status: AC
  Administered 2011-11-01: 4 mg via INTRAVENOUS

## 2011-11-01 NOTE — Discharge Instructions (Signed)
The etiology of epigastric abdominal pain not clear with trial of Pepcid in cases of peptic ulcer disease. Take as directed. Pain medicine as needed. Urinary tract infection is questionable but will treat with Keflex take antibiotic as necessary. Recommend you followup with your GYN Dr. for the right ovarian cyst should not be a cause of today's pain. Return for new or worse symptoms.

## 2011-11-01 NOTE — ED Provider Notes (Signed)
History     CSN: [id number]  Arrival date & time 11/01/11  4782   First MD Initiated Contact with Patient 11/01/11 [phone]      Chief Complaint  Patient presents with  . Abdominal Pain  . Possible Pregnancy    (Consider location/radiation/quality/duration/timing/severity/associated sxs/prior treatment) Patient is a 21 y.o. female presenting with abdominal pain. The history is provided by the patient.  Abdominal Pain The primary symptoms of the illness include abdominal pain. The primary symptoms of the illness do not include fever, fatigue, shortness of breath, nausea, vomiting, diarrhea, dysuria or vaginal bleeding. The current episode started more than 2 days ago (Onset 1 week ago but symptoms were on and off starting last night became constant epigastric abdominal pain.). The onset of the illness was sudden. The problem has been rapidly worsening.  Pregnant Now: Patient concerned about being pregnant. The patient has not had a change in bowel habit. Symptoms associated with the illness do not include hematuria or back pain.    Past Medical History  Diagnosis Date  . ADHD (attention deficit hyperactivity disorder)   . Pyelonephritis     Past Surgical History  Procedure Date  . Cholecystectomy   . Wisdom tooth extraction   . Ovarian cyst surgery   . Appendectomy   . Knee arthroscopy     History reviewed. No pertinent family history.  History  Substance Use Topics  . Smoking status: Never Smoker   . Smokeless tobacco: Not on file  . Alcohol Use: Yes    OB History    Grav Para Term Preterm Abortions TAB SAB Ect Mult Living                  Review of Systems  Constitutional: Negative for fever and fatigue.  HENT: Negative for congestion and neck pain.   Eyes: Negative for redness.  Respiratory: Negative for cough and shortness of breath.   Cardiovascular: Negative for chest pain.  Gastrointestinal: Positive for abdominal pain. Negative for nausea, vomiting and  diarrhea.  Genitourinary: Negative for dysuria, hematuria and vaginal bleeding.  Musculoskeletal: Negative for back pain.  Skin: Negative for rash.  Neurological: Negative for headaches.  Hematological: Does not bruise/bleed easily.    Allergies  Morphine and related and Strawberry  Home Medications   Current Outpatient Rx  Name Route Sig Dispense Refill  . ETONOGESTREL-ETHINYL ESTRADIOL 0.12-0.015 MG/24HR VA RING Vaginal Place 1 each vaginally every 28 (twenty-eight) days. Insert vaginally and leave in place for 3 consecutive weeks, then remove for 1 week.    . AMPHETAMINE-DEXTROAMPHET ER 30 MG PO CP24 Oral Take 30 mg by mouth every morning.      . CEPHALEXIN 500 MG PO CAPS Oral Take 1 capsule (500 mg total) by mouth 4 (four) times daily. 28 capsule 0  . FAMOTIDINE 20 MG PO TABS Oral Take 1 tablet (20 mg total) by mouth 2 (two) times daily. 30 tablet 0  . HYDROCODONE-ACETAMINOPHEN 5-325 MG PO TABS Oral Take 1-2 tablets by mouth every 6 (six) hours as needed for pain. 10 tablet 0  . VENLAFAXINE HCL 37.5 MG PO TABS Oral Take 37.5 mg by mouth daily.        BP 120/68  Pulse 82  Temp(Src) 98.2 F (36.8 C) (Oral)  Resp 18  Ht 5\' 2"  (1.575 m)  Wt 135 lb (61.236 kg)  BMI 24.69 kg/m2  SpO2 99%  LMP 10/15/2011  Physical Exam  Nursing note and vitals reviewed. Constitutional: She is oriented to  person, place, and time. She appears well-developed and well-nourished.  HENT:  Head: Normocephalic and atraumatic.  Mouth/Throat: Oropharynx is clear and moist.  Eyes: Conjunctivae are normal. Pupils are equal, round, and reactive to light.  Neck: Normal range of motion. Neck supple.  Cardiovascular: Normal rate, regular rhythm and normal heart sounds.   No murmur heard. Pulmonary/Chest: Breath sounds normal. No respiratory distress.  Abdominal: Soft. Bowel sounds are normal. There is tenderness.       Epigastric tenderness to palpation  Musculoskeletal: Normal range of motion. She  exhibits no edema and no tenderness.  Neurological: She is alert and oriented to person, place, and time. No cranial nerve deficit. She exhibits normal muscle tone. Coordination normal.  Skin: Skin is warm. No rash noted.    ED Course  Procedures (including critical care time)  Labs Reviewed  URINALYSIS, ROUTINE W REFLEX MICROSCOPIC - Abnormal; Notable for the following:    APPearance CLOUDY (*)    Leukocytes, UA SMALL (*)    All other components within normal limits  CBC - Abnormal; Notable for the following:    RBC 5.29 (*)    Hemoglobin 15.6 (*)    All other components within normal limits  URINE MICROSCOPIC-ADD ON - Abnormal; Notable for the following:    Squamous Epithelial / LPF FEW (*)    Bacteria, UA MANY (*)    All other components within normal limits  BASIC METABOLIC PANEL  PREGNANCY, URINE  LIPASE, BLOOD  HEPATIC FUNCTION PANEL   Ct Abdomen Pelvis W Contrast  11/01/2011  *RADIOLOGY REPORT*  Clinical Data: Upper abdominal pain, nausea, status post cholecystectomy, prior ovarian cyst removal  CT ABDOMEN AND PELVIS WITH CONTRAST  Technique:  Multidetector CT imaging of the abdomen and pelvis was performed following the standard protocol during bolus administration of intravenous contrast.  Contrast: OMNIPAQUE IOHEXOL 300 MG/ML  SOLN  Comparison: 06/03/2011  Findings: Very mild dependent atelectasis in the bilateral lower lobes.  Liver, spleen pancreas, and adrenal glands are within normal limits.  Status post cholecystectomy.  Mild intrahepatic ductal dilatation, likely postsurgical.  Tiny left upper pole cyst (series 2/image 28).  No hydronephrosis.  No evidence of bowel obstruction.  Normal appendix.  No evidence of abdominal aortic aneurysm.  Trace pelvic ascites, likely physiologic.  No suspicious abdominopelvic lymphadenopathy.  Uterus is within normal limits.  3.3 x 2.6 cm right ovarian cyst, similar to the prior study. Left ovary is within normal limits.  Bladder is  within normal limits.  Visualized osseous structures are within normal limits.  IMPRESSION: No evidence of bowel obstruction.  Normal appendix.  3.3 x 2.6 cm right ovarian cyst, similar to the prior study.  While benign appearing, it is unclear whether this reflects the same lesion or is a new physiologic cyst. Follow-up pelvic ultrasound is suggested in 6-12 weeks to document resolution.  Original Report Authenticated By: Charline Bills, M.D.    Results for orders placed during the hospital encounter of 11/01/11  URINALYSIS, ROUTINE W REFLEX MICROSCOPIC      Component Value Range   Color, Urine YELLOW  YELLOW    APPearance CLOUDY (*) CLEAR    Specific Gravity, Urine 1.017  1.005 - 1.030    pH 7.5  5.0 - 8.0    Glucose, UA NEGATIVE  NEGATIVE (mg/dL)   Hgb urine dipstick NEGATIVE  NEGATIVE    Bilirubin Urine NEGATIVE  NEGATIVE    Ketones, ur NEGATIVE  NEGATIVE (mg/dL)   Protein, ur NEGATIVE  NEGATIVE (  mg/dL)   Urobilinogen, UA 0.2  0.0 - 1.0 (mg/dL)   Nitrite NEGATIVE  NEGATIVE    Leukocytes, UA SMALL (*) NEGATIVE   CBC      Component Value Range   WBC 9.6  4.0 - 10.5 (K/uL)   RBC 5.29 (*) 3.87 - 5.11 (MIL/uL)   Hemoglobin 15.6 (*) 12.0 - 15.0 (g/dL)   HCT 40.9  81.1 - 91.4 (%)   MCV 86.0  78.0 - 100.0 (fL)   MCH 29.5  26.0 - 34.0 (pg)   MCHC 34.3  30.0 - 36.0 (g/dL)   RDW 78.2  95.6 - 21.3 (%)   Platelets 286  150 - 400 (K/uL)  BASIC METABOLIC PANEL      Component Value Range   Sodium 138  135 - 145 (mEq/L)   Potassium 4.1  3.5 - 5.1 (mEq/L)   Chloride 102  96 - 112 (mEq/L)   CO2 27  19 - 32 (mEq/L)   Glucose, Bld 97  70 - 99 (mg/dL)   BUN 9  6 - 23 (mg/dL)   Creatinine, Ser 0.86  0.50 - 1.10 (mg/dL)   Calcium 9.7  8.4 - 57.8 (mg/dL)   GFR calc non Af Amer >90  >90 (mL/min)   GFR calc Af Amer >90  >90 (mL/min)  PREGNANCY, URINE      Component Value Range   Preg Test, Ur NEGATIVE  NEGATIVE   LIPASE, BLOOD      Component Value Range   Lipase 21  11 - 59 (U/L)  HEPATIC  FUNCTION PANEL      Component Value Range   Total Protein 7.5  6.0 - 8.3 (g/dL)   Albumin 4.3  3.5 - 5.2 (g/dL)   AST 28  0 - 37 (U/L)   ALT 18  0 - 35 (U/L)   Alkaline Phosphatase 72  39 - 117 (U/L)   Total Bilirubin 0.3  0.3 - 1.2 (mg/dL)   Bilirubin, Direct <4.6  0.0 - 0.3 (mg/dL)   Indirect Bilirubin NOT CALCULATED  0.3 - 0.9 (mg/dL)  URINE MICROSCOPIC-ADD ON      Component Value Range   Squamous Epithelial / LPF FEW (*) RARE    WBC, UA 3-6  <3 (WBC/hpf)   Bacteria, UA MANY (*) RARE      1. Epigastric abdominal pain   2. Right ovarian cyst   3. Urinary tract infection       MDM  Workup without specific findings to explain the epigastric abdominal pain maybe peptic ulcer disease related we'll treat with Pepcid. Also urinalysis with questionable early urinary tract infection will treat with Keflex. CT with finding of right ovarian cyst patient states that this would be a new one because the previous one was removed. This should not be a cause of today's pain but she will followup with her GYN doctor about this.        Shelda Jakes, MD 11/01/11 [id number]

## 2011-11-01 NOTE — ED Notes (Signed)
Pt amb to room 1 with quick steady gait, teary, reports intermittent abd pain x 1 week with nausea, denies any vomiting or diarrhea. Pt states that she might be pregnant, and that she took a home preg test that was neg. Pt states she had an apt with her gyn today for labwork, but the pain became unbearable this morning so she presents here. Dr. Saul Fordyce at bedside for exam.

## 2011-11-11 ENCOUNTER — Encounter (HOSPITAL_COMMUNITY): Payer: Self-pay | Admitting: Emergency Medicine

## 2011-11-11 ENCOUNTER — Emergency Department (HOSPITAL_COMMUNITY)
Admission: EM | Admit: 2011-11-11 | Discharge: 2011-11-12 | Disposition: A | Discharge status: Home / Self Care | Payer: No Typology Code available for payment source | Attending: Emergency Medicine | Admitting: Emergency Medicine

## 2011-11-11 DIAGNOSIS — M542 Cervicalgia: Secondary | ICD-10-CM | POA: Insufficient documentation

## 2011-11-11 DIAGNOSIS — Z79899 Other long term (current) drug therapy: Secondary | ICD-10-CM | POA: Insufficient documentation

## 2011-11-11 DIAGNOSIS — T148XXA Other injury of unspecified body region, initial encounter: Secondary | ICD-10-CM | POA: Insufficient documentation

## 2011-11-11 DIAGNOSIS — M545 Low back pain, unspecified: Secondary | ICD-10-CM | POA: Insufficient documentation

## 2011-11-11 DIAGNOSIS — F909 Attention-deficit hyperactivity disorder, unspecified type: Secondary | ICD-10-CM | POA: Insufficient documentation

## 2011-11-11 DIAGNOSIS — F341 Dysthymic disorder: Secondary | ICD-10-CM | POA: Insufficient documentation

## 2011-11-11 HISTORY — DX: Major depressive disorder, single episode, unspecified: F32.9

## 2011-11-11 HISTORY — DX: Depression, unspecified: F32.A

## 2011-11-11 HISTORY — DX: Anxiety disorder, unspecified: F41.9
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
## 2011-11-12 ENCOUNTER — Emergency Department (HOSPITAL_COMMUNITY): Payer: No Typology Code available for payment source

## 2011-11-12 MED ORDER — OXYCODONE-ACETAMINOPHEN 5-325 MG PO TABS
1.0000 | ORAL_TABLET | Freq: Once | ORAL | Status: AC
  Administered 2011-11-12: 1 via ORAL
  Filled 2011-11-12: qty 1

## 2011-11-12 MED ORDER — HYDROCODONE-ACETAMINOPHEN 5-325 MG PO TABS: 1.0000 | ORAL_TABLET | Freq: Once | ORAL | Status: DC

## 2011-11-12 MED ORDER — IBUPROFEN 800 MG PO TABS: 800.0000 mg | ORAL_TABLET | Freq: Three times a day (TID) | ORAL | Status: AC

## 2011-11-12 MED ORDER — CYCLOBENZAPRINE HCL 10 MG PO TABS: 10.0000 mg | ORAL_TABLET | Freq: Three times a day (TID) | ORAL | Status: AC | PRN
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
## 2011-11-13 NOTE — ED Provider Notes (Signed)
Medical screening examination/treatment/procedure(s) were performed by non-physician practitioner and as supervising physician I was immediately available for consultation/collaboration.  Sunnie Nielsen, MD 11/13/11 0010

## 2015-07-29 ENCOUNTER — Encounter (HOSPITAL_BASED_OUTPATIENT_CLINIC_OR_DEPARTMENT_OTHER): Payer: Self-pay | Admitting: *Deleted

## 2015-07-29 ENCOUNTER — Emergency Department (HOSPITAL_BASED_OUTPATIENT_CLINIC_OR_DEPARTMENT_OTHER)
Admission: EM | Admit: 2015-07-29 | Discharge: 2015-07-30 | Discharge status: Against Medical Advice | Payer: Managed Care, Other (non HMO) | Attending: Emergency Medicine | Admitting: Emergency Medicine

## 2015-07-29 ENCOUNTER — Emergency Department (HOSPITAL_BASED_OUTPATIENT_CLINIC_OR_DEPARTMENT_OTHER): Payer: Managed Care, Other (non HMO)

## 2015-07-29 DIAGNOSIS — Z3A01 Less than 8 weeks gestation of pregnancy: Secondary | ICD-10-CM | POA: Diagnosis not present

## 2015-07-29 DIAGNOSIS — R197 Diarrhea, unspecified: Secondary | ICD-10-CM | POA: Insufficient documentation

## 2015-07-29 DIAGNOSIS — F909 Attention-deficit hyperactivity disorder, unspecified type: Secondary | ICD-10-CM | POA: Diagnosis not present

## 2015-07-29 DIAGNOSIS — Z87448 Personal history of other diseases of urinary system: Secondary | ICD-10-CM | POA: Diagnosis not present

## 2015-07-29 DIAGNOSIS — Z79899 Other long term (current) drug therapy: Secondary | ICD-10-CM | POA: Insufficient documentation

## 2015-07-29 DIAGNOSIS — O9989 Other specified diseases and conditions complicating pregnancy, childbirth and the puerperium: Secondary | ICD-10-CM | POA: Insufficient documentation

## 2015-07-29 DIAGNOSIS — F329 Major depressive disorder, single episode, unspecified: Secondary | ICD-10-CM | POA: Insufficient documentation

## 2015-07-29 DIAGNOSIS — R1084 Generalized abdominal pain: Secondary | ICD-10-CM | POA: Insufficient documentation

## 2015-07-29 DIAGNOSIS — F419 Anxiety disorder, unspecified: Secondary | ICD-10-CM | POA: Insufficient documentation

## 2015-07-29 DIAGNOSIS — O210 Mild hyperemesis gravidarum: Secondary | ICD-10-CM | POA: Diagnosis not present

## 2015-07-29 DIAGNOSIS — O219 Vomiting of pregnancy, unspecified: Secondary | ICD-10-CM

## 2015-07-29 HISTORY — DX: Complete or unspecified spontaneous abortion without complication: O03.9

## 2015-07-29 LAB — URINALYSIS, ROUTINE W REFLEX MICROSCOPIC
BILIRUBIN URINE: NEGATIVE
Glucose, UA: NEGATIVE mg/dL
HGB URINE DIPSTICK: NEGATIVE
Leukocytes, UA: NEGATIVE
Nitrite: NEGATIVE
PROTEIN: NEGATIVE mg/dL
SPECIFIC GRAVITY, URINE: 1.02 (ref 1.005–1.030)
pH: 6 (ref 5.0–8.0)

## 2015-07-29 LAB — CBC WITH DIFFERENTIAL/PLATELET
BASOS ABS: 0 10*3/uL (ref 0.0–0.1)
Basophils Relative: 0 %
EOS ABS: 0 10*3/uL (ref 0.0–0.7)
EOS PCT: 0 %
HCT: 39.7 % (ref 36.0–46.0)
Hemoglobin: 13.3 g/dL (ref 12.0–15.0)
LYMPHS PCT: 15 %
Lymphs Abs: 1.3 10*3/uL (ref 0.7–4.0)
MCH: 27.6 pg (ref 26.0–34.0)
MCHC: 33.5 g/dL (ref 30.0–36.0)
MCV: 82.4 fL (ref 78.0–100.0)
MONO ABS: 0.7 10*3/uL (ref 0.1–1.0)
Monocytes Relative: 8 %
Neutro Abs: 7 10*3/uL (ref 1.7–7.7)
Neutrophils Relative %: 77 %
PLATELETS: 270 10*3/uL (ref 150–400)
RBC: 4.82 MIL/uL (ref 3.87–5.11)
RDW: 14.3 % (ref 11.5–15.5)
WBC: 9 10*3/uL (ref 4.0–10.5)

## 2015-07-29 LAB — PREGNANCY, URINE: PREG TEST UR: POSITIVE — AB

## 2015-07-29 LAB — BASIC METABOLIC PANEL
ANION GAP: 8 (ref 5–15)
BUN: 6 mg/dL (ref 6–20)
CALCIUM: 9 mg/dL (ref 8.9–10.3)
CO2: 23 mmol/L (ref 22–32)
Chloride: 103 mmol/L (ref 101–111)
Creatinine, Ser: 0.63 mg/dL (ref 0.44–1.00)
GFR calc Af Amer: >60 mL/min (ref >60)
GLUCOSE: 106 mg/dL — AB (ref 65–99)
Potassium: 3.1 mmol/L — ABNORMAL LOW (ref 3.5–5.1)
Sodium: 134 mmol/L — ABNORMAL LOW (ref 135–145)

## 2015-07-29 LAB — HCG, QUANTITATIVE, PREGNANCY: hCG, Beta Chain, Quant, S: 146886 m[IU]/mL — ABNORMAL HIGH (ref <5)

## 2015-07-29 MED ORDER — SODIUM CHLORIDE 0.9 % IV BOLUS (SEPSIS)
1000.0000 mL | Freq: Once | INTRAVENOUS | Status: AC
  Administered 2015-07-29: 1000 mL via INTRAVENOUS

## 2015-07-29 NOTE — ED Notes (Addendum)
Lower abdominal pain, diarrhea, vomiting, dry heaves, not eating today. She has had an US with this pregnancy which shows an intra uterine pregnancy.

## 2015-07-29 NOTE — ED Notes (Signed)
Pt c/o n/v/d that started today with sick contacts in home.  She also c/o lower abdominal cramping that started about 2 hours after having sex last night with scant amount of brown discharge.  Pt is [redacted] weeks pregnant and states she had an ultrasound outside of the system that showed a viable pregnancy.  Pt states she has had about 10 emesis episodes today.

## 2015-07-30 DIAGNOSIS — O210 Mild hyperemesis gravidarum: Secondary | ICD-10-CM | POA: Diagnosis not present

## 2015-07-30 MED ORDER — METOCLOPRAMIDE HCL 5 MG/ML IJ SOLN
10.0000 mg | Freq: Once | INTRAMUSCULAR | Status: AC
  Administered 2015-07-30: 10 mg via INTRAVENOUS
  Filled 2015-07-30: qty 2

## 2015-07-30 MED ORDER — POTASSIUM CHLORIDE CRYS ER 20 MEQ PO TBCR
40.0000 meq | EXTENDED_RELEASE_TABLET | Freq: Once | ORAL | Status: AC
  Administered 2015-07-30: 40 meq via ORAL
  Filled 2015-07-30: qty 2

## 2015-07-30 MED ORDER — DIPHENHYDRAMINE HCL 50 MG/ML IJ SOLN
25.0000 mg | Freq: Once | INTRAMUSCULAR | Status: AC
  Administered 2015-07-30: 25 mg via INTRAVENOUS
  Filled 2015-07-30: qty 1

## 2015-07-30 MED ORDER — SODIUM CHLORIDE 0.9 % IV BOLUS (SEPSIS)
1000.0000 mL | Freq: Once | INTRAVENOUS | Status: AC
  Administered 2015-07-30: 1000 mL via INTRAVENOUS

## 2015-07-30 NOTE — ED Notes (Signed)
Pt states she needs to leave, advised that if things worsen she needs to come back to hospital.  Pt able to tolerate PO meds

## 2015-07-30 NOTE — ED Notes (Signed)
Pt back to Ultrasound

## 2015-07-30 NOTE — ED Provider Notes (Signed)
CSN: [id number]     Arrival date & time 07/29/15  2020 History   First MD Initiated Contact with Patient 07/30/15 0026     Chief Complaint  Patient presents with  . N/V/D      (Consider location/radiation/quality/duration/timing/severity/associated sxs/prior Treatment) HPI  This is a 25 year old female who is about [redacted] weeks pregnant. She is here with nausea, vomiting and diarrhea that began about 2 AM yesterday. She has had multiple episodes of vomiting. She has had sick contacts recently. Associated symptoms include crampy pain across her lower abdomen. She was given a liter of IV fluids prior to my evaluation but continues to feel nauseated and thirsty. She rates her pain as a 7 out of 10.  Past Medical History  Diagnosis Date  . ADHD (attention deficit hyperactivity disorder)   . Pyelonephritis   . Depression   . Anxiety   . Miscarriage    Past Surgical History  Procedure Laterality Date  . Cholecystectomy    . Wisdom tooth extraction    . Ovarian cyst surgery    . Knee arthroscopy     No family history on file. Social History  Substance Use Topics  . Smoking status: Never Smoker   . Smokeless tobacco: None  . Alcohol Use: Yes   OB History    Gravida Para Term Preterm AB TAB SAB Ectopic Multiple Living   1              Review of Systems  All other systems reviewed and are negative.   Allergies  Morphine and related and Strawberry extract  Home Medications   Prior to Admission medications   Medication Sig Start Date End Date Taking? Authorizing Provider  amphetamine-dextroamphetamine (ADDERALL XR) 30 MG 24 hr capsule Take 30 mg by mouth every morning.      Historical Provider, MD  escitalopram (LEXAPRO) 20 MG tablet Take 20 mg by mouth daily.    Historical Provider, MD  etonogestrel-ethinyl estradiol (NUVARING) 0.12-0.015 MG/24HR vaginal ring Place 1 each vaginally every 28 (twenty-eight) days. Insert vaginally and leave in place for 3 consecutive weeks, then  remove for 1 week.    Historical Provider, MD  famotidine (PEPCID) 20 MG tablet Take 1 tablet (20 mg total) by mouth 2 (two) times daily. 11/01/11 10/31/12  Vanetta Mulders, MD   BP 113/57 mmHg  Pulse 91  Temp(Src) 98.5 F (36.9 C) (Oral)  Resp 18  Ht 5\' 1"  (1.549 m)  Wt 180 lb (81.647 kg)  BMI 34.03 kg/m2  SpO2 100%  LMP 06/10/2015   Physical Exam  General: Well-developed, well-nourished female in no acute distress; appearance consistent with age of record HENT: normocephalic; atraumatic Eyes: pupils equal, round and reactive to light; extraocular muscles intact Neck: supple Heart: regular rate and rhythm Lungs: clear to auscultation bilaterally Abdomen: soft; nondistended; diffusely tender; no masses or hepatosplenomegaly; bowel sounds present Extremities: No deformity; full range of motion; pulses normal Neurologic: Awake, alert and oriented; motor function intact in all extremities and symmetric; no facial droop Skin: Warm and dry Psychiatric: Normal mood and affect    ED Course  Procedures (including critical care time)   MDM   Nursing notes and vitals signs, including pulse oximetry, reviewed.  Summary of this visit's results, reviewed by myself:  Labs:  Results for orders placed or performed during the hospital encounter of 07/29/15 (from the past 24 hour(s))  Urinalysis, Routine w reflex microscopic (not at [facility])     Status: Abnormal   Collection  Time: 07/29/15  8:30 PM  Result Value Ref Range   Color, Urine YELLOW YELLOW   APPearance CLEAR CLEAR   Specific Gravity, Urine 1.020 1.005 - 1.030   pH 6.0 5.0 - 8.0   Glucose, UA NEGATIVE NEGATIVE mg/dL   Hgb urine dipstick NEGATIVE NEGATIVE   Bilirubin Urine NEGATIVE NEGATIVE   Ketones, ur >80 (A) NEGATIVE mg/dL   Protein, ur NEGATIVE NEGATIVE mg/dL   Nitrite NEGATIVE NEGATIVE   Leukocytes, UA NEGATIVE NEGATIVE  Pregnancy, urine     Status: Abnormal   Collection Time: 07/29/15  8:30 PM  Result Value Ref Range    Preg Test, Ur POSITIVE (A) NEGATIVE  CBC with Differential     Status: None   Collection Time: 07/29/15 10:15 PM  Result Value Ref Range   WBC 9.0 4.0 - 10.5 K/uL   RBC 4.82 3.87 - 5.11 MIL/uL   Hemoglobin 13.3 12.0 - 15.0 g/dL   HCT 16.1 09.6 - 04.5 %   MCV 82.4 78.0 - 100.0 fL   MCH 27.6 26.0 - 34.0 pg   MCHC 33.5 30.0 - 36.0 g/dL   RDW 40.9 81.1 - 91.4 %   Platelets 270 150 - 400 K/uL   Neutrophils Relative % 77 %   Neutro Abs 7.0 1.7 - 7.7 K/uL   Lymphocytes Relative 15 %   Lymphs Abs 1.3 0.7 - 4.0 K/uL   Monocytes Relative 8 %   Monocytes Absolute 0.7 0.1 - 1.0 K/uL   Eosinophils Relative 0 %   Eosinophils Absolute 0.0 0.0 - 0.7 K/uL   Basophils Relative 0 %   Basophils Absolute 0.0 0.0 - 0.1 K/uL  Basic metabolic panel     Status: Abnormal   Collection Time: 07/29/15 10:15 PM  Result Value Ref Range   Sodium 134 (L) 135 - 145 mmol/L   Potassium 3.1 (L) 3.5 - 5.1 mmol/L   Chloride 103 101 - 111 mmol/L   CO2 23 22 - 32 mmol/L   Glucose, Bld 106 (H) 65 - 99 mg/dL   BUN 6 6 - 20 mg/dL   Creatinine, Ser 7.82 0.44 - 1.00 mg/dL   Calcium 9.0 8.9 - 95.6 mg/dL   GFR calc non Af Amer >60 >60 mL/min   GFR calc Af Amer >60 >60 mL/min   Anion gap 8 5 - 15  hCG, quantitative, pregnancy     Status: Abnormal   Collection Time: 07/29/15 10:15 PM  Result Value Ref Range   hCG, Beta Chain, Quant, S 213086 (H) <5 mIU/mL    Imaging Studies: US Ob Comp Less 14 Wks  08/08/2015  CLINICAL DATA:  25 year old pregnant female with pelvic pain and cramping EXAM: OBSTETRIC <14 WK ULTRASOUND TECHNIQUE: Transabdominal ultrasound was performed for evaluation of the gestation as well as the maternal uterus and adnexal regions. COMPARISON:  None. FINDINGS: There is a single intrauterine gestational sac containing a fetal pole and yolk sac. An echogenic structure adjacent to the fetal pole on initial images was thought to represent a possible second fetal pole. However, additional cine images were  performed and no definite twin pregnancy is identified. An echogenic structure seen adjacent to the fetal pole on the transverse cine clip series 1002, image 5/154 is likely continuation of the of the fetal pole. No definite additional fetal pole identified on the sagittal cine clip. Repeat ultrasound in 1-2 weeks is recommended for better evaluation. Fetal cardiac activity was detected at 162 beats per minute. CRL:   12  mm  7 w 2 d                  US EDC: 03/15/2016 Subchorionic hemorrhage:  None visualized. Maternal uterus/adnexae: The maternal ovaries appear unremarkable. No free fluid identified. IMPRESSION: Live intrauterine pregnancy with an estimated gestational age of [redacted] weeks, 2 days. Electronically Signed   By: Elgie CollardArash  Radparvar M.D.   On: 07/30/2015 01:08   [place] Ob Transvaginal  07/30/2015  CLINICAL DATA:  25 year old pregnant female with pelvic pain and cramping EXAM: OBSTETRIC <14 WK ULTRASOUND TECHNIQUE: Transabdominal ultrasound was performed for evaluation of the gestation as well as the maternal uterus and adnexal regions. COMPARISON:  None. FINDINGS: There is a single intrauterine gestational sac containing a fetal pole and yolk sac. An echogenic structure adjacent to the fetal pole on initial images was thought to represent a possible second fetal pole. However, additional cine images were performed and no definite twin pregnancy is identified. An echogenic structure seen adjacent to the fetal pole on the transverse cine clip series 1002, image 5/154 is likely continuation of the of the fetal pole. No definite additional fetal pole identified on the sagittal cine clip. Repeat ultrasound in 1-2 weeks is recommended for better evaluation. Fetal cardiac activity was detected at 162 beats per minute. CRL:   12  mm   7 w 2 d                  US EDC: 03/15/2016 Subchorionic hemorrhage:  None visualized. Maternal uterus/adnexae: The maternal ovaries appear unremarkable. No free fluid identified.  IMPRESSION: Live intrauterine pregnancy with an estimated gestational age of [redacted] weeks, 2 days. Electronically Signed   By: Elgie CollardArash  Radparvar M.D.   On: 07/30/2015 01:08   1:23 AM Patient eloped before I could reassess her.    Paula LibraJohn Effa Yarrow, MD 07/30/15 [phone]

## 2015-07-30 NOTE — ED Notes (Signed)
Pt returned from US

## 2016-06-02 ENCOUNTER — Emergency Department (HOSPITAL_BASED_OUTPATIENT_CLINIC_OR_DEPARTMENT_OTHER)
Admission: EM | Admit: 2016-06-02 | Discharge: 2016-06-02 | Disposition: A | Discharge status: Home / Self Care | Payer: Managed Care, Other (non HMO) | Attending: Emergency Medicine | Admitting: Emergency Medicine

## 2016-06-02 ENCOUNTER — Encounter (HOSPITAL_BASED_OUTPATIENT_CLINIC_OR_DEPARTMENT_OTHER): Payer: Self-pay | Admitting: *Deleted

## 2016-06-02 DIAGNOSIS — F909 Attention-deficit hyperactivity disorder, unspecified type: Secondary | ICD-10-CM | POA: Insufficient documentation

## 2016-06-02 DIAGNOSIS — N3000 Acute cystitis without hematuria: Secondary | ICD-10-CM | POA: Insufficient documentation

## 2016-06-02 LAB — PREGNANCY, URINE: Preg Test, Ur: NEGATIVE

## 2016-06-02 LAB — URINALYSIS, ROUTINE W REFLEX MICROSCOPIC
Bilirubin Urine: NEGATIVE
GLUCOSE, UA: NEGATIVE mg/dL
Hgb urine dipstick: NEGATIVE
KETONES UR: NEGATIVE mg/dL
NITRITE: NEGATIVE
PH: 5.5 (ref 5.0–8.0)
PROTEIN: NEGATIVE mg/dL
Specific Gravity, Urine: 1.021 (ref 1.005–1.030)

## 2016-06-02 LAB — URINE MICROSCOPIC-ADD ON

## 2016-06-02 MED ORDER — CEPHALEXIN 500 MG PO CAPS: 500.0000 mg | ORAL_CAPSULE | Freq: Four times a day (QID) | ORAL | 0 refills | Status: DC

## 2016-06-02 MED FILL — CEPHALEXIN 250 MG/5 ML SUSP: 250 | 5 days supply | Qty: 200 | Fill #0

## 2016-06-02 NOTE — Discharge Instructions (Signed)
Keflex as prescribed.  Return to the emergency department if you develop high fever, abdominal pain, or a worsening of your symptoms.

## 2016-06-02 NOTE — ED Triage Notes (Signed)
Frequency, dysuria x 2 days. States she feels like she has a UTI.

## 2016-06-02 NOTE — ED Provider Notes (Signed)
[facility]   CSN: [id number] Arrival date & time: 06/02/16  1101     History   Chief Complaint Chief Complaint  Patient presents with  . Urinary Tract Infection    HPI Amy Lutz is a 25 y.o. female.  Patient is a 25 year old female with no significant past medical history. She presents for evaluation of sure you in frequency for the past 2 days. She denies any fevers, chills, abdominal pain. She has a 3010-week-old son and has not started her period.   The history is provided by the patient.  Urinary Tract Infection   This is a new problem. The current episode started yesterday. The problem occurs every urination. The problem has been gradually worsening. The quality of the pain is described as burning. The pain is moderate. There has been no fever.    Past Medical History:  Diagnosis Date  . ADHD (attention deficit hyperactivity disorder)   . Anxiety   . Depression   . Miscarriage   . Pyelonephritis     There are no active problems to display for this patient.   Past Surgical History:  Procedure Laterality Date  . CHOLECYSTECTOMY    . KNEE ARTHROSCOPY    . OVARIAN CYST SURGERY    . WISDOM TOOTH EXTRACTION      OB History    Gravida Para Term Preterm AB Living   1             SAB TAB Ectopic Multiple Live Births                   Home Medications    Prior to Admission medications   Medication Sig Start Date End Date Taking? Authorizing Provider  amphetamine-dextroamphetamine (ADDERALL XR) 30 MG 24 hr capsule Take 30 mg by mouth every morning.      Historical Provider, MD  escitalopram (LEXAPRO) 20 MG tablet Take 20 mg by mouth daily.    Historical Provider, MD  etonogestrel-ethinyl estradiol (NUVARING) 0.12-0.015 MG/24HR vaginal ring Place 1 each vaginally every 28 (twenty-eight) days. Insert vaginally and leave in place for 3 consecutive weeks, then remove for 1 week.    Historical Provider, MD  famotidine (PEPCID) 20 MG  tablet Take 1 tablet (20 mg total) by mouth 2 (two) times daily. 11/01/11 10/31/12  Vanetta MuldersScott Zackowski, MD    Family History No family history on file.  Social History Social History  Substance Use Topics  . Smoking status: Never Smoker  . Smokeless tobacco: Never Used  . Alcohol use Yes     Allergies   Morphine and related and Strawberry extract   Review of Systems Review of Systems  All other systems reviewed and are negative.    Physical Exam Updated Vital Signs BP 106/57   Pulse 71   Temp 98 F (36.7 C) (Oral)   Resp 20   Ht 5\' 2"  (1.575 m)   Wt 170 lb (77.1 kg)   LMP 05/10/2015   SpO2 99%   Breastfeeding? Yes   BMI 31.09 kg/m   Physical Exam  Constitutional: She is oriented to person, place, and time. She appears well-developed and well-nourished. No distress.  HENT:  Head: Normocephalic and atraumatic.  Neck: Normal range of motion. Neck supple.  Abdominal: Soft. Bowel sounds are normal. She exhibits no distension. There is no tenderness.  Musculoskeletal: Normal range of motion.  Neurological: She is alert and oriented to person, place, and time.  Skin: Skin is warm and  dry. She is not diaphoretic.  Nursing note and vitals reviewed.    ED Treatments / Results  Labs (all labs ordered are listed, but only abnormal results are displayed) Labs Reviewed  URINALYSIS, ROUTINE W REFLEX MICROSCOPIC (NOT AT [facility])  PREGNANCY, URINE    EKG  EKG Interpretation None       Radiology No results found.  Procedures Procedures (including critical care time)  Medications Ordered in ED Medications - No data to display   Initial Impression / Assessment and Plan / ED Course  I have reviewed the triage vital signs and the nursing notes.  Pertinent labs & imaging results that were available during my care of the patient were reviewed by me and considered in my medical decision making (see chart for details).  Clinical Course     Urinalysis equivocal for  UTI. Due to her history and symptomatology. She will be treated with Keflex and when necessary return.  Final Clinical Impressions(s) / ED Diagnoses   Final diagnoses:  None    New Prescriptions New Prescriptions   No medications on file     Geoffery Lyonsouglas Djuan Talton, MD 06/02/16 1158

## 2016-08-11 ENCOUNTER — Encounter (HOSPITAL_BASED_OUTPATIENT_CLINIC_OR_DEPARTMENT_OTHER): Payer: Self-pay | Admitting: *Deleted

## 2016-08-11 ENCOUNTER — Emergency Department (HOSPITAL_BASED_OUTPATIENT_CLINIC_OR_DEPARTMENT_OTHER): Payer: Self-pay

## 2016-08-11 ENCOUNTER — Emergency Department (HOSPITAL_BASED_OUTPATIENT_CLINIC_OR_DEPARTMENT_OTHER)
Admission: EM | Admit: 2016-08-11 | Discharge: 2016-08-11 | Disposition: A | Discharge status: Home / Self Care | Payer: Self-pay | Attending: Emergency Medicine | Admitting: Emergency Medicine

## 2016-08-11 DIAGNOSIS — R059 Cough, unspecified: Secondary | ICD-10-CM

## 2016-08-11 DIAGNOSIS — F909 Attention-deficit hyperactivity disorder, unspecified type: Secondary | ICD-10-CM | POA: Insufficient documentation

## 2016-08-11 DIAGNOSIS — R0981 Nasal congestion: Secondary | ICD-10-CM | POA: Insufficient documentation

## 2016-08-11 DIAGNOSIS — R0789 Other chest pain: Secondary | ICD-10-CM | POA: Insufficient documentation

## 2016-08-11 DIAGNOSIS — R05 Cough: Secondary | ICD-10-CM | POA: Insufficient documentation

## 2016-08-11 DIAGNOSIS — Z79899 Other long term (current) drug therapy: Secondary | ICD-10-CM | POA: Insufficient documentation

## 2016-08-11 MED ORDER — BENZONATATE 100 MG PO CAPS: 100.0000 mg | ORAL_CAPSULE | Freq: Three times a day (TID) | ORAL | 0 refills | Status: DC

## 2016-08-11 NOTE — ED Triage Notes (Signed)
Cough for a week.  

## 2016-08-11 NOTE — ED Provider Notes (Signed)
[facility]   CSN: [id number] Arrival date & time: 08/11/16  1809  By signing my name below, I, Amy Lutz, attest that this documentation has been prepared under the direction and in the presence of non-physician practitioner, Candie Mile, PA-C. Electronically Signed: Nelwyn Lutz, Scribe. 08/11/2016. 8:00 PM.  History   Chief Complaint Chief Complaint  Patient presents with  . Cough   The history is provided by the patient. No language interpreter was used.    HPI Comments:  Amy Lutz is a 26 y.o. female who presents to the Emergency Department complaining of worsening, frequent productive cough beginning two weeks ago. He describes her symptoms as being worse at night. She reports associated chest congestion, chills, and chest pain secondary to cough. Pt has tried robitussin, mucinex medicines with no relief. She reports being seen at [facility] on 22nd where they did an chest xray and was negative. She states her symptoms have not improved since then. She denies any fevers, ear pain, nausea, vomiting, dysuria, constipation or difficulty urinating. Pt is a current smoker.   Past Medical History:  Diagnosis Date  . ADHD (attention deficit hyperactivity disorder)   . Anxiety   . Depression   . Miscarriage   . Pyelonephritis     There are no active problems to display for this patient.   Past Surgical History:  Procedure Laterality Date  . CHOLECYSTECTOMY    . KNEE ARTHROSCOPY    . OVARIAN CYST SURGERY    . WISDOM TOOTH EXTRACTION      OB History    Gravida Para Term Preterm AB Living   1             SAB TAB Ectopic Multiple Live Births                   Home Medications    Prior to Admission medications   Medication Sig Start Date End Date Taking? Authorizing Provider  amphetamine-dextroamphetamine (ADDERALL XR) 30 MG 24 hr capsule Take 30 mg by mouth every morning.      Historical Provider, MD  benzonatate (TESSALON) 100 MG  capsule Take 1 capsule (100 mg total) by mouth every 8 (eight) hours. 08/11/16   Amy Lutz, [place]  cephALEXin (KEFLEX) 500 MG capsule Take 1 capsule (500 mg total) by mouth 4 (four) times daily. 06/02/16   Geoffery Lyons, MD  escitalopram (LEXAPRO) 20 MG tablet Take 20 mg by mouth daily.    Historical Provider, MD  etonogestrel-ethinyl estradiol (NUVARING) 0.12-0.015 MG/24HR vaginal ring Place 1 each vaginally every 28 (twenty-eight) days. Insert vaginally and leave in place for 3 consecutive weeks, then remove for 1 week.    Historical Provider, MD  famotidine (PEPCID) 20 MG tablet Take 1 tablet (20 mg total) by mouth 2 (two) times daily. 11/01/11 10/31/12  Vanetta Mulders, MD    Family History No family history on file.  Social History Social History  Substance Use Topics  . Smoking status: Never Smoker  . Smokeless tobacco: Never Used  . Alcohol use Yes     Allergies   Morphine and related and Strawberry extract   Review of Systems Review of Systems  Constitutional: Positive for chills. Negative for fever.  HENT: Positive for congestion. Negative for ear pain.   Respiratory: Positive for cough and chest tightness.   Gastrointestinal: Negative for constipation, nausea and vomiting.  Genitourinary: Negative for difficulty urinating and dysuria.     Physical Exam Updated Vital  Signs BP (!) 117/47   Pulse 73   Temp 98.2 F (36.8 C) (Oral)   Resp 18   Ht 5\' 2"  (1.575 m)   Wt 81.6 kg   SpO2 99%   BMI 32.92 kg/m   Physical Exam  Constitutional: She is oriented to person, place, and time. She appears well-developed and well-nourished. No distress.  Well appearing  HENT:  Head: Normocephalic and atraumatic.  Right Ear: External ear normal.  Left Ear: External ear normal.  Nose: Nose normal.  Mouth/Throat: Oropharynx is clear and moist. No oropharyngeal exudate.  Throat shows no tonsillar erythema, swelling or exudates. Oropharynx not erythematous   Eyes:  Conjunctivae and EOM are normal. Pupils are equal, round, and reactive to light.  Neck: Normal range of motion.  Cardiovascular: Normal rate, regular rhythm and normal heart sounds.   Pulmonary/Chest: Effort normal and breath sounds normal. No respiratory distress. She has no wheezes.  Clear to auscultation. No wheezing or rales.   Abdominal: Soft. Bowel sounds are normal. She exhibits no distension. There is no tenderness. There is no rebound and no guarding.  Musculoskeletal: Normal range of motion.  Lymphadenopathy:    She has no cervical adenopathy.  Neurological: She is alert and oriented to person, place, and time.  Skin: Skin is warm and dry.  Psychiatric: She has a normal mood and affect.  Nursing note and vitals reviewed.  ED Treatments / Results  DIAGNOSTIC STUDIES:  Oxygen Saturation is 99% on RA, normal by my interpretation.    COORDINATION OF CARE:  8:02 PM Discussed treatment plan with pt at bedside which includes imaging and pt agreed to plan.  Labs (all labs ordered are listed, but only abnormal results are displayed) Labs Reviewed - No data to display  EKG  EKG Interpretation None       Radiology Dg Chest 2 View  Result Date: 08/11/2016 CLINICAL DATA:  Cough, chest pain, shortness of breath and body aches 2 weeks. EXAM: CHEST  2 VIEW COMPARISON:  04/24/2015 FINDINGS: Lungs are adequately inflated and otherwise clear. Cardiomediastinal silhouette, bones and soft tissues are within normal. IMPRESSION: No active cardiopulmonary disease. Electronically Signed   By: Elberta Fortis M.D.   On: 08/11/2016 20:24    Procedures Procedures (including critical care time)  Medications Ordered in ED Medications - No data to display   Initial Impression / Assessment and Plan / ED Course  I have reviewed the triage vital signs and the nursing notes.  Pertinent labs & imaging results that were available during my care of the patient were reviewed by me and considered  in my medical decision making (see chart for details).    On exam, pt in NAD. VSS. No hypoxia. Afebrile. Lungs clear, Heart sounds clear. Normal work of breathing. TMs clear. Throat benign. Abdomen nontender/soft. Pt CXR negative for acute infiltrate. Patients symptoms are consistent with URI, likely viral etiology. Discussed that antibiotics are not indicated for viral infections. Pt will be discharged with symptomatic treatment for persistent cough and encouraged conservative treatment at home.  Verbalizes understanding and is agreeable with plan. Pt is hemodynamically stable & in NAD prior to dc. Encouraged to follow up with PCP in 3-5 days as needed. Return precautions given.   Final Clinical Impressions(s) / ED Diagnoses   Final diagnoses:  Cough    New Prescriptions Discharge Medication List as of 08/11/2016  9:06 PM    START taking these medications   Details  benzonatate (TESSALON) 100 MG capsule Take  1 capsule (100 mg total) by mouth every 8 (eight) hours., Starting Fri 08/11/2016, Print      I personally performed the services described in this documentation, which was scribed in my presence. The recorded information has been reviewed and is accurate.    [address]Paraskevi Funez Manuel JaneEspina, [place] 08/12/16 1217    Loren Raceravid Yelverton, MD 08/13/16 [phone]

## 2016-08-11 NOTE — ED Notes (Signed)
ED Provider at bedside. 

## 2016-08-11 NOTE — ED Notes (Signed)
EDP at bedside  

## 2016-08-11 NOTE — ED Notes (Signed)
Pt has negative chest xray in chart from [facility]

## 2016-08-11 NOTE — Discharge Instructions (Signed)
Please take Tessalon Perles as needed to help cough. Continue symptomatic treatment at home. Drink plenty of fluids throughout the day. Take 1 teaspoon of honey 3 times a day to help cough. Follow up with your primary care physician in 3-5 days if symptoms do not improve.  Contact a health care provider if: You have new symptoms. You cough up pus. Your cough does not get better after 2-3 weeks, or your cough gets worse. You cannot control your cough with suppressant medicines and you are losing sleep. You develop pain that is getting worse or pain that is not controlled with pain medicines. You have a fever. You have unexplained weight loss. You have night sweats. Get help right away if: You cough up blood. You have difficulty breathing. Your heartbeat is very fast.

## 2016-08-11 NOTE — ED Notes (Signed)
Pt verbalizes understanding of d/c instructions and denies any further needs at this time. 

## 2017-01-20 ENCOUNTER — Encounter (HOSPITAL_BASED_OUTPATIENT_CLINIC_OR_DEPARTMENT_OTHER): Payer: Self-pay | Admitting: Emergency Medicine

## 2017-01-20 ENCOUNTER — Emergency Department (HOSPITAL_BASED_OUTPATIENT_CLINIC_OR_DEPARTMENT_OTHER)
Admission: EM | Admit: 2017-01-20 | Discharge: 2017-01-20 | Disposition: A | Discharge status: Home / Self Care | Payer: Self-pay | Attending: Emergency Medicine | Admitting: Emergency Medicine

## 2017-01-20 DIAGNOSIS — Z79899 Other long term (current) drug therapy: Secondary | ICD-10-CM | POA: Insufficient documentation

## 2017-01-20 DIAGNOSIS — F909 Attention-deficit hyperactivity disorder, unspecified type: Secondary | ICD-10-CM | POA: Insufficient documentation

## 2017-01-20 DIAGNOSIS — N12 Tubulo-interstitial nephritis, not specified as acute or chronic: Secondary | ICD-10-CM | POA: Insufficient documentation

## 2017-01-20 LAB — URINALYSIS, ROUTINE W REFLEX MICROSCOPIC

## 2017-01-20 LAB — PREGNANCY, URINE: PREG TEST UR: NEGATIVE

## 2017-01-20 LAB — URINALYSIS, MICROSCOPIC (REFLEX)

## 2017-01-20 MED ORDER — CEPHALEXIN 500 MG PO CAPS: 500.0000 mg | ORAL_CAPSULE | Freq: Four times a day (QID) | ORAL | 0 refills | Status: DC

## 2017-01-20 MED ORDER — ACETAMINOPHEN 500 MG PO TABS
1000.0000 mg | ORAL_TABLET | Freq: Once | ORAL | Status: AC
  Administered 2017-01-20: 1000 mg via ORAL
  Filled 2017-01-20: qty 2

## 2017-01-20 MED ORDER — KETOROLAC TROMETHAMINE 60 MG/2ML IM SOLN
30.0000 mg | Freq: Once | INTRAMUSCULAR | Status: AC
  Administered 2017-01-20: 30 mg via INTRAMUSCULAR
  Filled 2017-01-20: qty 2

## 2017-01-20 NOTE — Discharge Instructions (Signed)
Take 4 over the counter ibuprofen tablets 3 times a day or 2 over-the-counter naproxen tablets twice a day for pain. Also take tylenol 1000mg (2 extra strength) four times a day.    Take your abx as prescribed.  Return for fever, worsening symptoms.

## 2017-01-20 NOTE — ED Triage Notes (Signed)
Dysuria, urinary frequency and lower back pain x 1 month, worse over the past week. Taking AZO at home.

## 2017-01-20 NOTE — ED Provider Notes (Signed)
[facility] Provider Note   CSN: [id number] Arrival date & time: 01/20/17  1125     History   Chief Complaint Chief Complaint  Patient presents with  . Dysuria    HPI Amy Lutz is a 26 y.o. female.  26 yo F with a chief complaints of dysuria and bilateral flank pain. Going on for the past couple months. She's been taking Azo with initially some minimal relief and then now without relief. Denies fevers or chills.   The history is provided by the patient.  Dysuria   This is a chronic problem. The current episode started more than 1 week ago. The problem occurs every urination. The problem has been gradually worsening. The quality of the pain is described as burning, stabbing, shooting and aching. The pain is at a severity of 9/10. The pain is severe. There has been no fever. Associated symptoms include frequency, urgency and flank pain. Pertinent negatives include no chills, no nausea and no vomiting. Treatments tried: azo. Her past medical history does not include kidney stones.    Past Medical History:  Diagnosis Date  . ADHD (attention deficit hyperactivity disorder)   . Anxiety   . Depression   . Miscarriage   . Pyelonephritis     There are no active problems to display for this patient.   Past Surgical History:  Procedure Laterality Date  . CHOLECYSTECTOMY    . KNEE ARTHROSCOPY    . OVARIAN CYST SURGERY    . WISDOM TOOTH EXTRACTION      OB History    Gravida Para Term Preterm AB Living   1             SAB TAB Ectopic Multiple Live Births                   Home Medications    Prior to Admission medications   Medication Sig Start Date End Date Taking? Authorizing Provider  amphetamine-dextroamphetamine (ADDERALL XR) 30 MG 24 hr capsule Take 30 mg by mouth every morning.      [provider]  benzonatate (TESSALON) 100 MG capsule Take 1 capsule (100 mg total) by mouth every 8 (eight) hours. 08/11/16   Espina, Lucita Lora, PA    cephALEXin (KEFLEX) 500 MG capsule Take 1 capsule (500 mg total) by mouth 4 (four) times daily. 01/20/17   Melene Plan, DO  escitalopram (LEXAPRO) 20 MG tablet Take 20 mg by mouth daily.    [provider]  etonogestrel-ethinyl estradiol (NUVARING) 0.12-0.015 MG/24HR vaginal ring Place 1 each vaginally every 28 (twenty-eight) days. Insert vaginally and leave in place for 3 consecutive weeks, then remove for 1 week.    [provider]  famotidine (PEPCID) 20 MG tablet Take 1 tablet (20 mg total) by mouth 2 (two) times daily. 11/01/11 10/31/12  Vanetta Mulders, MD    Family History No family history on file.  Social History Social History  Substance Use Topics  . Smoking status: Never Smoker  . Smokeless tobacco: Never Used  . Alcohol use Yes     Allergies   Morphine and related and Strawberry extract   Review of Systems Review of Systems  Constitutional: Negative for chills and fever.  HENT: Negative for congestion and rhinorrhea.   Eyes: Negative for redness and visual disturbance.  Respiratory: Negative for shortness of breath and wheezing.   Cardiovascular: Negative for chest pain and palpitations.  Gastrointestinal: Negative for nausea and vomiting.  Genitourinary: Positive for dysuria,  flank pain, frequency and urgency.  Musculoskeletal: Negative for arthralgias and myalgias.  Skin: Negative for pallor and wound.  Neurological: Negative for dizziness and headaches.     Physical Exam Updated Vital Signs BP 98/81 (BP Location: Right Arm)   Pulse 85   Temp 98.3 F (36.8 C) (Oral)   Resp 18   Ht 5\' 2"  (1.575 m)   Wt 84.8 kg (187 lb)   SpO2 97%   BMI 34.20 kg/m   Physical Exam  Constitutional: She is oriented to person, place, and time. She appears well-developed and well-nourished. No distress.  HENT:  Head: Normocephalic and atraumatic.  Eyes: EOM are normal. Pupils are equal, round, and reactive to light.  Neck: Normal range of motion. Neck  supple.  Cardiovascular: Normal rate and regular rhythm.  Exam reveals no gallop and no friction rub.   No murmur heard. Pulmonary/Chest: Effort normal. She has no wheezes. She has no rales.  Abdominal: Soft. She exhibits no distension and no mass. There is no tenderness. There is no guarding.  Bilateral cva ttp   Musculoskeletal: She exhibits no edema or tenderness.  Neurological: She is alert and oriented to person, place, and time.  Skin: Skin is warm and dry. She is not diaphoretic.  Psychiatric: She has a normal mood and affect. Her behavior is normal.  Nursing note and vitals reviewed.    ED Treatments / Results  Labs (all labs ordered are listed, but only abnormal results are displayed) Labs Reviewed  URINALYSIS, ROUTINE W REFLEX MICROSCOPIC - Abnormal; Notable for the following:       Result Value   Color, Urine RED (*)    APPearance TURBID (*)    Glucose, UA   (*)    Value: TEST NOT REPORTED DUE TO COLOR INTERFERENCE OF URINE PIGMENT   Hgb urine dipstick   (*)    Value: TEST NOT REPORTED DUE TO COLOR INTERFERENCE OF URINE PIGMENT   Bilirubin Urine   (*)    Value: TEST NOT REPORTED DUE TO COLOR INTERFERENCE OF URINE PIGMENT   Ketones, ur   (*)    Value: TEST NOT REPORTED DUE TO COLOR INTERFERENCE OF URINE PIGMENT   Protein, ur   (*)    Value: TEST NOT REPORTED DUE TO COLOR INTERFERENCE OF URINE PIGMENT   Nitrite   (*)    Value: TEST NOT REPORTED DUE TO COLOR INTERFERENCE OF URINE PIGMENT   Leukocytes, UA   (*)    Value: TEST NOT REPORTED DUE TO COLOR INTERFERENCE OF URINE PIGMENT   All other components within normal limits  URINALYSIS, MICROSCOPIC (REFLEX) - Abnormal; Notable for the following:    Bacteria, UA MANY (*)    Squamous Epithelial / LPF TOO NUMEROUS TO COUNT (*)    All other components within normal limits  PREGNANCY, URINE    EKG  EKG Interpretation None       Radiology No results found.  Procedures Procedures (including critical care  time)  Medications Ordered in ED Medications  ketorolac (TORADOL) injection 30 mg (30 mg Intramuscular Given 01/20/17 1152)  acetaminophen (TYLENOL) tablet 1,000 mg (1,000 mg Oral Given 01/20/17 1152)     Initial Impression / Assessment and Plan / ED Course  I have reviewed the triage vital signs and the nursing notes.  Pertinent labs & imaging results that were available during my care of the patient were reviewed by me and considered in my medical decision making (see chart for details).  26 yo F With bilateral flank pain and dysuria. Likely pyelonephritis by history. Will obtain a UA.  UA difficult to interpret due to Azo cranberry use. With her symptoms and bacteria seen on microscope will treat. Discussed with the patient that she should limit her Azo use only 2 days time. I recommended urology follow-up she stated that she had interstitial cystitis and sees urology fairly frequently.  2:57 PM:  I have discussed the diagnosis/risks/treatment options with the patient and family and believe the pt to be eligible for discharge home to follow-up with Urology. We also discussed returning to the ED immediately if new or worsening sx occur. We discussed the sx which are most concerning (e.g., sudden worsening pain, fever, inability to tolerate by mouth) that necessitate immediate return. Medications administered to the patient during their visit and any new prescriptions provided to the patient are listed below.  Medications given during this visit Medications  ketorolac (TORADOL) injection 30 mg (30 mg Intramuscular Given 01/20/17 1152)  acetaminophen (TYLENOL) tablet 1,000 mg (1,000 mg Oral Given 01/20/17 1152)     The patient appears reasonably screen and/or stabilized for discharge and I doubt any other medical condition or other [facility] requiring further screening, evaluation, or treatment in the ED at this time prior to discharge.    Final Clinical Impressions(s) / ED Diagnoses   Final  diagnoses:  Pyelonephritis    New Prescriptions Discharge Medication List as of 01/20/2017 12:15 PM       Melene PlanFloyd, Shavy Beachem, DO 01/20/17 1457

## 2017-01-23 ENCOUNTER — Telehealth (HOSPITAL_BASED_OUTPATIENT_CLINIC_OR_DEPARTMENT_OTHER): Payer: Self-pay | Admitting: Emergency Medicine

## 2017-01-23 MED FILL — levoFLOXacin 750 MG TABS: 750 | 5 days supply | Qty: 5 | Fill #0

## 2017-04-01 ENCOUNTER — Encounter (HOSPITAL_BASED_OUTPATIENT_CLINIC_OR_DEPARTMENT_OTHER): Payer: Self-pay | Admitting: Emergency Medicine

## 2017-04-01 ENCOUNTER — Emergency Department (HOSPITAL_BASED_OUTPATIENT_CLINIC_OR_DEPARTMENT_OTHER)
Admission: EM | Admit: 2017-04-01 | Discharge: 2017-04-01 | Disposition: A | Discharge status: Home / Self Care | Payer: Self-pay | Attending: Physician Assistant | Admitting: Physician Assistant

## 2017-04-01 DIAGNOSIS — J039 Acute tonsillitis, unspecified: Secondary | ICD-10-CM | POA: Insufficient documentation

## 2017-04-01 DIAGNOSIS — Z79899 Other long term (current) drug therapy: Secondary | ICD-10-CM | POA: Insufficient documentation

## 2017-04-01 LAB — HCG, SERUM, QUALITATIVE: PREG SERUM: NEGATIVE

## 2017-04-01 LAB — MONONUCLEOSIS SCREEN: Mono Screen: NEGATIVE

## 2017-04-01 LAB — RAPID STREP SCREEN (MED CTR MEBANE ONLY): Streptococcus, Group A Screen (Direct): NEGATIVE

## 2017-04-01 MED ORDER — AMOXICILLIN-POT CLAVULANATE 250-62.5 MG/5ML PO SUSR: 875.0000 mg | Freq: Two times a day (BID) | ORAL | 0 refills | Status: AC

## 2017-04-01 MED ORDER — AMOXICILLIN-POT CLAVULANATE 875-125 MG PO TABS
1.0000 | ORAL_TABLET | Freq: Once | ORAL | Status: AC
  Administered 2017-04-01: 1 via ORAL
  Filled 2017-04-01: qty 1

## 2017-04-01 MED ORDER — AMOXICILLIN-POT CLAVULANATE 875-125 MG PO TABS: 1.0000 | ORAL_TABLET | Freq: Two times a day (BID) | ORAL | 0 refills | Status: DC

## 2017-04-01 MED ORDER — ONDANSETRON 4 MG PO TBDP: 4.0000 mg | ORAL_TABLET | Freq: Three times a day (TID) | ORAL | 0 refills | Status: DC | PRN

## 2017-04-01 MED ORDER — DEXAMETHASONE SODIUM PHOSPHATE 10 MG/ML IJ SOLN
10.0000 mg | Freq: Once | INTRAMUSCULAR | Status: AC
  Administered 2017-04-01: 10 mg via INTRAMUSCULAR
  Filled 2017-04-01: qty 1

## 2017-04-01 MED ORDER — ONDANSETRON 4 MG PO TBDP
4.0000 mg | ORAL_TABLET | Freq: Once | ORAL | Status: AC
  Administered 2017-04-01: 4 mg via ORAL
  Filled 2017-04-01: qty 1

## 2017-04-01 NOTE — ED Provider Notes (Signed)
[facility]   CSN: [id number] Arrival date & time: 04/01/17  1053     History   Chief Complaint Chief Complaint  Patient presents with  . Sore Throat    HPI Amy Lutz is a 26 y.o. female.  HPI   Patient is a 26 year old female presenting here with sore throat and fatigue since Monday. She also has mild ear fullness. No fevers. Very fatigued. Never had these symtpoms before  Past Medical History:  Diagnosis Date  . ADHD (attention deficit hyperactivity disorder)   . Anxiety   . Depression   . Miscarriage   . Pyelonephritis     There are no active problems to display for this patient.   Past Surgical History:  Procedure Laterality Date  . CHOLECYSTECTOMY    . KNEE ARTHROSCOPY    . OVARIAN CYST SURGERY    . WISDOM TOOTH EXTRACTION      OB History    Gravida Para Term Preterm AB Living   1             SAB TAB Ectopic Multiple Live Births                   Home Medications    Prior to Admission medications   Medication Sig Start Date End Date Taking? Authorizing Provider  amphetamine-dextroamphetamine (ADDERALL XR) 30 MG 24 hr capsule Take 30 mg by mouth every morning.      [provider]  benzonatate (TESSALON) 100 MG capsule Take 1 capsule (100 mg total) by mouth every 8 (eight) hours. 08/11/16   Espina, Lucita Lora, PA  cephALEXin (KEFLEX) 500 MG capsule Take 1 capsule (500 mg total) by mouth 4 (four) times daily. 01/20/17   Melene Plan, DO  escitalopram (LEXAPRO) 20 MG tablet Take 20 mg by mouth daily.    [provider]  etonogestrel-ethinyl estradiol (NUVARING) 0.12-0.015 MG/24HR vaginal ring Place 1 each vaginally every 28 (twenty-eight) days. Insert vaginally and leave in place for 3 consecutive weeks, then remove for 1 week.    [provider]  famotidine (PEPCID) 20 MG tablet Take 1 tablet (20 mg total) by mouth 2 (two) times daily. 11/01/11 10/31/12  Vanetta Mulders, MD    Family  History History reviewed. No pertinent family history.  Social History Social History  Substance Use Topics  . Smoking status: Never Smoker  . Smokeless tobacco: Never Used  . Alcohol use Yes     Allergies   Amoxicillin; Morphine and related; and Strawberry extract   Review of Systems Review of Systems  Constitutional: Positive for fatigue. Negative for activity change.  HENT: Positive for ear pain, sinus pressure and sore throat. Negative for trouble swallowing.   Respiratory: Negative for shortness of breath.   Cardiovascular: Negative for chest pain.  Gastrointestinal: Negative for abdominal pain.     Physical Exam Updated Vital Signs BP 124/68 (BP Location: Right Arm)   Pulse 79   Temp 98.8 F (37.1 C) (Oral)   Resp 16   Ht  (1.549 m)   Wt 87 kg (191 lb 12.8 oz)   LMP 03/07/2017   SpO2 100%   BMI 36.24 kg/m   Physical Exam  Constitutional: She is oriented to person, place, and time. She appears well-developed and well-nourished.  HENT:  Head: Normocephalic and atraumatic.  Right Ear: External ear normal.  Left Ear: External ear normal.  Mild opacification, no erythema/  Giant onsils, inflammed with exudate  Eyes: Pupils  are equal, round, and reactive to light. EOM are normal. Right eye exhibits no discharge. Left eye exhibits no discharge.  Neck: Normal range of motion.  Cardiovascular: Normal rate.   Pulmonary/Chest: Effort normal.  Lymphadenopathy:    She has cervical adenopathy.  Neurological: She is oriented to person, place, and time.  Skin: Skin is warm and dry. She is not diaphoretic.  Psychiatric: She has a normal mood and affect.  Nursing note and vitals reviewed.    ED Treatments / Results  Labs (all labs ordered are listed, but only abnormal results are displayed) Labs Reviewed  RAPID STREP SCREEN (NOT AT [facility])  CULTURE, GROUP A STREP [facility])  MONONUCLEOSIS SCREEN    EKG  EKG Interpretation None       Radiology No  results found.  Procedures Procedures (including critical care time)  Medications Ordered in ED Medications  dexamethasone (DECADRON) injection 10 mg (not administered)     Initial Impression / Assessment and Plan / ED Course  I have reviewed the triage vital signs and the nursing notes.  Pertinent labs & imaging results that were available during my care of the patient were reviewed by me and considered in my medical decision making (see chart for details).    Pt is here with sore throat.  Given fatigue, and symptoms will check strep and mono. Physical exam shows incredibly inflamed tonsils with exudate.   Will treat for tonsilitis. Pt toleratign secretions, no evidence of PTA or cellulitis.    Final Clinical Impressions(s) / ED Diagnoses   Final diagnoses:  None    New Prescriptions New Prescriptions   No medications on file     Abelino Derrick, MD 04/01/17 1327

## 2017-04-01 NOTE — ED Triage Notes (Signed)
Patient states that she has had a sore throat since Monday  - denies any fever.

## 2017-04-03 LAB — CULTURE, GROUP A STREP (THRC)

## 2018-02-13 ENCOUNTER — Encounter (HOSPITAL_BASED_OUTPATIENT_CLINIC_OR_DEPARTMENT_OTHER): Payer: Self-pay

## 2018-02-13 ENCOUNTER — Emergency Department (HOSPITAL_BASED_OUTPATIENT_CLINIC_OR_DEPARTMENT_OTHER)
Admission: EM | Admit: 2018-02-13 | Discharge: 2018-02-13 | Disposition: A | Discharge status: Home / Self Care | Payer: Self-pay | Attending: Emergency Medicine | Admitting: Emergency Medicine

## 2018-02-13 ENCOUNTER — Other Ambulatory Visit: Payer: Self-pay

## 2018-02-13 DIAGNOSIS — N12 Tubulo-interstitial nephritis, not specified as acute or chronic: Secondary | ICD-10-CM

## 2018-02-13 DIAGNOSIS — Z79899 Other long term (current) drug therapy: Secondary | ICD-10-CM | POA: Insufficient documentation

## 2018-02-13 DIAGNOSIS — F17210 Nicotine dependence, cigarettes, uncomplicated: Secondary | ICD-10-CM | POA: Insufficient documentation

## 2018-02-13 DIAGNOSIS — N10 Acute tubulo-interstitial nephritis: Secondary | ICD-10-CM | POA: Insufficient documentation

## 2018-02-13 LAB — CBC WITH DIFFERENTIAL/PLATELET
Basophils Absolute: 0 10*3/uL (ref 0.0–0.1)
Basophils Relative: 0 %
EOS PCT: 0 %
Eosinophils Absolute: 0.1 10*3/uL (ref 0.0–0.7)
HCT: 41 % (ref 36.0–46.0)
Hemoglobin: 13.6 g/dL (ref 12.0–15.0)
LYMPHS ABS: 2.2 10*3/uL (ref 0.7–4.0)
LYMPHS PCT: 19 %
MCH: 28.4 pg (ref 26.0–34.0)
MCHC: 33.2 g/dL (ref 30.0–36.0)
MCV: 85.6 fL (ref 78.0–100.0)
MONO ABS: 0.8 10*3/uL (ref 0.1–1.0)
Monocytes Relative: 7 %
Neutro Abs: 8.4 10*3/uL — ABNORMAL HIGH (ref 1.7–7.7)
Neutrophils Relative %: 74 %
PLATELETS: 337 10*3/uL (ref 150–400)
RBC: 4.79 MIL/uL (ref 3.87–5.11)
RDW: 13.4 % (ref 11.5–15.5)
WBC: 11.6 10*3/uL — AB (ref 4.0–10.5)

## 2018-02-13 LAB — BASIC METABOLIC PANEL
Anion gap: 10 (ref 5–15)
BUN: 10 mg/dL (ref 6–20)
CO2: 24 mmol/L (ref 22–32)
Calcium: 9.1 mg/dL (ref 8.9–10.3)
Chloride: 105 mmol/L (ref 98–111)
Creatinine, Ser: 0.79 mg/dL (ref 0.44–1.00)
GFR calc Af Amer: >60 mL/min (ref >60)
GFR calc non Af Amer: >60 mL/min (ref >60)
GLUCOSE: 86 mg/dL (ref 70–99)
Potassium: 3.9 mmol/L (ref 3.5–5.1)
Sodium: 139 mmol/L (ref 135–145)

## 2018-02-13 LAB — URINALYSIS, ROUTINE W REFLEX MICROSCOPIC
Glucose, UA: 250 mg/dL — AB
KETONES UR: 15 mg/dL — AB
NITRITE: POSITIVE — AB
PROTEIN: 100 mg/dL — AB
Specific Gravity, Urine: 1.025 (ref 1.005–1.030)
pH: 5 (ref 5.0–8.0)

## 2018-02-13 LAB — PREGNANCY, URINE: Preg Test, Ur: NEGATIVE

## 2018-02-13 LAB — CBG MONITORING, ED: Glucose-Capillary: 78 mg/dL (ref 70–99)

## 2018-02-13 LAB — URINALYSIS, MICROSCOPIC (REFLEX)

## 2018-02-13 MED ORDER — ONDANSETRON 4 MG PO TBDP: 4.0000 mg | ORAL_TABLET | Freq: Three times a day (TID) | ORAL | 0 refills | Status: DC | PRN

## 2018-02-13 MED ORDER — FENTANYL CITRATE (PF) 100 MCG/2ML IJ SOLN
75.0000 ug | Freq: Once | INTRAMUSCULAR | Status: AC
  Administered 2018-02-13: 75 ug via INTRAVENOUS
  Filled 2018-02-13: qty 2

## 2018-02-13 MED ORDER — ONDANSETRON HCL 4 MG/2ML IJ SOLN: 4.0000 mg | Freq: Once | INTRAMUSCULAR | Status: DC

## 2018-02-13 MED ORDER — CEPHALEXIN 500 MG PO CAPS: 500.0000 mg | ORAL_CAPSULE | Freq: Two times a day (BID) | ORAL | 0 refills | Status: AC

## 2018-02-13 MED ORDER — ONDANSETRON HCL 4 MG/2ML IJ SOLN
4.0000 mg | Freq: Once | INTRAMUSCULAR | Status: AC
  Administered 2018-02-13: 4 mg via INTRAVENOUS
  Filled 2018-02-13: qty 2

## 2018-02-13 MED ORDER — ONDANSETRON HCL 4 MG/2ML IJ SOLN
2.0000 mg | Freq: Once | INTRAMUSCULAR | Status: AC
  Administered 2018-02-13: 2 mg via INTRAVENOUS
  Filled 2018-02-13: qty 2

## 2018-02-13 MED ORDER — SODIUM CHLORIDE 0.9 % IV BOLUS
1000.0000 mL | Freq: Once | INTRAVENOUS | Status: AC
  Administered 2018-02-13: 1000 mL via INTRAVENOUS

## 2018-02-13 MED ORDER — CEPHALEXIN 500 MG PO CAPS: 500.0000 mg | ORAL_CAPSULE | Freq: Two times a day (BID) | ORAL | 0 refills | Status: DC

## 2018-02-13 MED FILL — ONDANSETRON ODT 4 MG TABLET: 4 | 4 days supply | Qty: 12 | Fill #0

## 2018-02-13 MED FILL — CEPHALEXIN 500 MG CAPSULE: 500 | 10 days supply | Qty: 20 | Fill #0

## 2018-02-13 NOTE — ED Provider Notes (Signed)
[facility] EMERGENCY DEPARTMENT Provider Note   CSN: [id number] Arrival date & time: 02/13/18  1205     History   Chief Complaint Chief Complaint  Patient presents with  . Dysuria    HPI Amy Lutz is a 27 y.o. female presenting for 1 week of dysuria and flank pain.  Patient states that her pain began gradually and has worsened over the past few days.  She describes burning/throbbing pain when she urinates.  She states she has been taking AZO and Tylenol for her pain without relief.  Patient states that she is also been having left-sided flank pain for the past 2 days, she describes it as a throbbing pain made worse with movement.  Patient also endorses nausea, denies vomiting.  Patient states that she has had similar symptoms in the past and has been treated with Keflex.  Chart reviewed, patient with history of pyelonephritis last seen in our system on 01/20/2017.  Patient denies history of fever, vomiting, diarrhea, headache.  HPI  Past Medical History:  Diagnosis Date  . ADHD (attention deficit hyperactivity disorder)   . Anxiety   . Depression   . Miscarriage   . Pyelonephritis     There are no active problems to display for this patient.   Past Surgical History:  Procedure Laterality Date  . CHOLECYSTECTOMY    . KNEE ARTHROSCOPY    . OVARIAN CYST SURGERY    . WISDOM TOOTH EXTRACTION       OB History    Gravida  1   Para      Term      Preterm      AB      Living        SAB      TAB      Ectopic      Multiple      Live Births               Home Medications    Prior to Admission medications   Medication Sig Start Date End Date Taking? Authorizing Provider  amoxicillin-clavulanate (AUGMENTIN) 875-125 MG tablet Take 1 tablet by mouth every 12 (twelve) hours. 04/01/17   Mackuen, Courteney Lyn, MD  amphetamine-dextroamphetamine (ADDERALL XR) 30 MG 24 hr capsule Take 30 mg by mouth every morning.      [provider]    benzonatate (TESSALON) 100 MG capsule Take 1 capsule (100 mg total) by mouth every 8 (eight) hours. 08/11/16   Espina, Lucita LoraFrancisco Manuel, PA  cephALEXin (KEFLEX) 500 MG capsule Take 1 capsule (500 mg total) by mouth 2 (two) times daily for 10 days. 02/13/18 02/23/18  Harlene SaltsMorelli, Jobina Maita A, PA-C  escitalopram (LEXAPRO) 20 MG tablet Take 20 mg by mouth daily.    [provider]  etonogestrel-ethinyl estradiol (NUVARING) 0.12-0.015 MG/24HR vaginal ring Place 1 each vaginally every 28 (twenty-eight) days. Insert vaginally and leave in place for 3 consecutive weeks, then remove for 1 week.    [provider]  famotidine (PEPCID) 20 MG tablet Take 1 tablet (20 mg total) by mouth 2 (two) times daily. 11/01/11 10/31/12  Vanetta MuldersZackowski, Scott, MD  ondansetron (ZOFRAN ODT) 4 MG disintegrating tablet Take 1 tablet (4 mg total) by mouth every 8 (eight) hours as needed for nausea or vomiting. 02/13/18   Bill SalinasMorelli, Jacqulyn Barresi A, PA-C    Family History No family history on file.  Social History Social History   Tobacco Use  . Smoking status: Current Every Day Smoker  Types: Cigarettes  . Smokeless tobacco: Never Used  Substance Use Topics  . Alcohol use: Yes    Comment: occ  . Drug use: No     Allergies   Amoxicillin; Morphine and related; and Strawberry extract   Review of Systems Review of Systems  Constitutional: Negative.  Negative for chills, fatigue and fever.  HENT: Negative.  Negative for rhinorrhea and sore throat.   Eyes: Negative.  Negative for visual disturbance.  Respiratory: Negative.  Negative for cough and shortness of breath.   Cardiovascular: Negative.  Negative for chest pain.  Gastrointestinal: Positive for nausea. Negative for abdominal pain, blood in stool, diarrhea and vomiting.  Genitourinary: Positive for dysuria and flank pain. Negative for hematuria, pelvic pain, vaginal bleeding and vaginal discharge.  Musculoskeletal: Negative for arthralgias and myalgias.   Skin: Negative.  Negative for rash.  Neurological: Negative.  Negative for dizziness, syncope, weakness, numbness and headaches.     Physical Exam Updated Vital Signs BP (!) 112/59 (BP Location: Right Arm)   Pulse 93   Temp 97.9 F (36.6 C) (Oral)   Resp 19   Ht 5\' 2"  (1.575 m)   Wt 81.6 kg (180 lb)   LMP 01/30/2018 (Approximate)   SpO2 99%   BMI 32.92 kg/m   Physical Exam  Constitutional: She is oriented to person, place, and time. She appears well-developed and well-nourished. No distress.  HENT:  Head: Normocephalic and atraumatic.  Right Ear: External ear normal.  Left Ear: External ear normal.  Nose: Nose normal.  Eyes: Pupils are equal, round, and reactive to light. Conjunctivae and EOM are normal.  Neck: Trachea normal and normal range of motion. No tracheal deviation present.  Cardiovascular: Normal rate, regular rhythm, normal heart sounds and intact distal pulses.  Pulmonary/Chest: Effort normal and breath sounds normal. No respiratory distress.  Abdominal: Soft. Normal appearance and bowel sounds are normal. There is no tenderness. There is CVA tenderness. There is no rigidity, no rebound, no guarding, no tenderness at McBurney's point and negative Murphy's sign.  Left CVA tenderness.  Genitourinary:  Genitourinary Comments: Exam deferred by patient.  Musculoskeletal: Normal range of motion.  Neurological: She is alert and oriented to person, place, and time. She has normal strength.  Skin: Skin is warm and dry. Capillary refill takes less than 2 seconds.  Psychiatric: She has a normal mood and affect. Her behavior is normal.     ED Treatments / Results  Labs (all labs ordered are listed, but only abnormal results are displayed) Labs Reviewed  URINALYSIS, ROUTINE W REFLEX MICROSCOPIC - Abnormal; Notable for the following components:      Result Value   Color, Urine ORANGE (*)    APPearance CLOUDY (*)    Glucose, UA 250 (*)    Hgb urine dipstick TRACE (*)     Bilirubin Urine SMALL (*)    Ketones, ur 15 (*)    Protein, ur 100 (*)    Nitrite POSITIVE (*)    Leukocytes, UA TRACE (*)    All other components within normal limits  URINALYSIS, MICROSCOPIC (REFLEX) - Abnormal; Notable for the following components:   Bacteria, UA FEW (*)    All other components within normal limits  CBC WITH DIFFERENTIAL/PLATELET - Abnormal; Notable for the following components:   WBC 11.6 (*)    Neutro Abs 8.4 (*)    All other components within normal limits  URINE CULTURE  PREGNANCY, URINE  BASIC METABOLIC PANEL  CBG MONITORING, ED  EKG None  Radiology No results found.  Procedures Procedures (including critical care time)  Medications Ordered in ED Medications  sodium chloride 0.9 % bolus 1,000 mL ( Intravenous Stopped 02/13/18 1443)  fentaNYL (SUBLIMAZE) injection 75 mcg (75 mcg Intravenous Given 02/13/18 1345)  ondansetron (ZOFRAN) injection 4 mg (4 mg Intravenous Given 02/13/18 1351)  ondansetron (ZOFRAN) injection 2 mg (2 mg Intravenous Given 02/13/18 1538)     Initial Impression / Assessment and Plan / ED Course  I have reviewed the triage vital signs and the nursing notes.  Pertinent labs & imaging results that were available during my care of the patient were reviewed by me and considered in my medical decision making (see chart for details).  Clinical Course as of Feb 13 1629  Wed Feb 13, 2018  1617 I have personally ambulated this patient around the department. Patient ambulated well but endorses nausea. Patient denied dizziness, lightheadedness.     [BM]    Clinical Course User Index [BM] Bill Salinas, PA-C   Patient presenting for 1 week of UTI symptoms with clinical diagnosis of pyelonephritis.  Patient given fluid bolus, nausea medication and pain control here in department.  Pregnancy test negative.  Due to abnormal urinalysis with 250 glucose basic labs were performed.  CBC and BMP unremarkable except white blood cell  count of 11.6.  It is possible that the patient's use of AZO is contributing to her glucose in her urine this was discussed with Dr. Criss Alvine, CBG performed results 72.  Patient's blood pressure noted to be low in department today.  Patient denies dizziness, lightheadedness, weakness.  Fluid bolus given.  Patient is afebrile, not tachycardic, not tachypneic, white blood cell count under 12,000.  Patient does not meet sirs criteria at this time.  Patient is being discharged with a course of Keflex for pyelonephritis as well as Zofran for her nausea.  Patient informed that if she is not feeling better within the next 2 days to return to the emergency department for further evaluation and treatment.  I have also informed the patient that she should follow-up with her urologist/primary care provider for further evaluation and treatment as well.  Patient is ambulatory here in department, denying dizziness or lightheadedness.  Patient tolerating p.o. solids and liquids.  Patient appears stable for outpatient treatment of her pyelonephritis.  Patient appears to have reliable follow-up care.  At this time there does not appear to be any evidence of an acute emergency medical condition and the patient appears stable for discharge with appropriate outpatient follow up. Diagnosis was discussed with patient who verbalizes understanding of care plan and is agreeable to discharge. I have discussed return precautions with patient and family who verbalize understanding of return precautions. Patient strongly encouraged to follow-up with their PCP. All questions answered.  Patient's case discussed with chart reviewed by Dr. Criss Alvine who agrees with plan to discharge with with outpatient antibiotics follow-up.     Note: Portions of this report may have been transcribed using voice recognition software. Every effort was made to ensure accuracy; however, inadvertent computerized transcription errors may still be  present.    Final Clinical Impressions(s) / ED Diagnoses   Final diagnoses:  Pyelonephritis    ED Discharge Orders        Ordered    cephALEXin (KEFLEX) 500 MG capsule  2 times daily     02/13/18 1503    ondansetron (ZOFRAN ODT) 4 MG disintegrating tablet  Every 8 hours PRN  02/13/18 1617       Bill Salinas, PA-C 02/13/18 1815    Pricilla Loveless, MD 02/18/18 1500

## 2018-02-13 NOTE — ED Notes (Signed)
ED Provider at bedside. 

## 2018-02-13 NOTE — ED Notes (Signed)
Pt able to ambulate and ready to be d/c

## 2018-02-13 NOTE — Discharge Instructions (Addendum)
Please follow-up with your primary care provider as soon as possible regarding your visit today. You may also follow-up with your urologist as soon as possible for further evaluation. Return to the emergency department for any new or worsening symptoms or if your symptoms do not resolve in the next few days. Please take all of your antibiotic medication, Keflex, as prescribed You may use the antinausea medication, Zofran as prescribed. Your urine was sent for culture, you will be contacted if bacteria grows that are not treated by the Keflex.  Contact a health care provider if: Your symptoms do not get better after 2 days of treatment. Your symptoms get worse. You have a fever. Get help right away if: You are unable to take your antibiotics or fluids. You have shaking chills. You vomit. You have severe flank or back pain. You have extreme weakness or fainting.

## 2018-02-13 NOTE — ED Triage Notes (Signed)
Pt c/o UTI sx x 1 week-pain to bilat flank x 2 days-reports hx of recurrent UTIs-NAD-steady gait

## 2018-02-13 NOTE — ED Notes (Signed)
Pt lying flat for orthostatic vital signs. 

## 2018-02-14 LAB — URINE CULTURE

## 2018-02-18 ENCOUNTER — Other Ambulatory Visit: Payer: Self-pay

## 2018-02-18 ENCOUNTER — Encounter (HOSPITAL_BASED_OUTPATIENT_CLINIC_OR_DEPARTMENT_OTHER): Payer: Self-pay | Admitting: Emergency Medicine

## 2018-02-18 ENCOUNTER — Emergency Department (HOSPITAL_BASED_OUTPATIENT_CLINIC_OR_DEPARTMENT_OTHER)
Admission: EM | Admit: 2018-02-18 | Discharge: 2018-02-18 | Disposition: A | Discharge status: Home / Self Care | Payer: Self-pay | Attending: Emergency Medicine | Admitting: Emergency Medicine

## 2018-02-18 DIAGNOSIS — F17210 Nicotine dependence, cigarettes, uncomplicated: Secondary | ICD-10-CM | POA: Insufficient documentation

## 2018-02-18 DIAGNOSIS — Z79899 Other long term (current) drug therapy: Secondary | ICD-10-CM | POA: Insufficient documentation

## 2018-02-18 DIAGNOSIS — F329 Major depressive disorder, single episode, unspecified: Secondary | ICD-10-CM | POA: Insufficient documentation

## 2018-02-18 DIAGNOSIS — Z9049 Acquired absence of other specified parts of digestive tract: Secondary | ICD-10-CM | POA: Insufficient documentation

## 2018-02-18 DIAGNOSIS — F419 Anxiety disorder, unspecified: Secondary | ICD-10-CM | POA: Insufficient documentation

## 2018-02-18 DIAGNOSIS — F909 Attention-deficit hyperactivity disorder, unspecified type: Secondary | ICD-10-CM | POA: Insufficient documentation

## 2018-02-18 DIAGNOSIS — R300 Dysuria: Secondary | ICD-10-CM | POA: Insufficient documentation

## 2018-02-18 LAB — URINALYSIS, MICROSCOPIC (REFLEX)

## 2018-02-18 LAB — URINALYSIS, ROUTINE W REFLEX MICROSCOPIC
Bilirubin Urine: NEGATIVE
Glucose, UA: 100 mg/dL — AB
Hgb urine dipstick: NEGATIVE
KETONES UR: NEGATIVE mg/dL
LEUKOCYTES UA: NEGATIVE
NITRITE: POSITIVE — AB
PH: 5 (ref 5.0–8.0)
PROTEIN: NEGATIVE mg/dL
Specific Gravity, Urine: <1.005 — ABNORMAL LOW (ref 1.005–1.030)

## 2018-02-18 MED ORDER — CIPROFLOXACIN HCL 500 MG PO TABS: 500.0000 mg | ORAL_TABLET | Freq: Two times a day (BID) | ORAL | 0 refills | Status: DC

## 2018-02-18 MED FILL — CIPROFLOXACIN HCL 500 MG TA: 500 | 7 days supply | Qty: 14 | Fill #0

## 2018-02-18 NOTE — ED Provider Notes (Signed)
[facility] EMERGENCY DEPARTMENT Provider Note   CSN: [id number] Arrival date & time: 02/18/18  1119     History   Chief Complaint Chief Complaint  Patient presents with  . Dysuria    HPI Amy Lutz is a 27 y.o. female.  Patient is a 27 year old female with a history of frequent UTIs and pyelonephritis who was seen here approximately 6 days ago for symptoms suggestive of a UTI and placed on Keflex.  Patient states she was taking the flex but it was making her feel very sick to her stomach and not herself.  She stopped taking it yesterday but she continues to have the frequency, urgency and dysuria.  She denies any fever, back pain, abdominal pain.  The pain is only present with urination.  She is sexually active with only her husband for over 5 years and has no vaginal symptoms.  LMP was last week.  The history is provided by the patient.  Dysuria   This is a recurrent problem. Episode onset: persistent for the last 6 days. The problem occurs every urination. The problem has not changed since onset.The quality of the pain is described as burning and shooting. The pain is at a severity of 3/10. The pain is moderate. There has been no fever. She is sexually active. There is a history of pyelonephritis. Associated symptoms include frequency and urgency. Pertinent negatives include no chills, no nausea, no vomiting, no discharge, no hematuria, no possible pregnancy and no flank pain. She has tried antibiotics for the symptoms. Her past medical history is significant for recurrent UTIs. Her past medical history does not include kidney stones.    Past Medical History:  Diagnosis Date  . ADHD (attention deficit hyperactivity disorder)   . Anxiety   . Depression   . Miscarriage   . Pyelonephritis     There are no active problems to display for this patient.   Past Surgical History:  Procedure Laterality Date  . CHOLECYSTECTOMY    . KNEE ARTHROSCOPY    . OVARIAN CYST  SURGERY    . WISDOM TOOTH EXTRACTION       OB History    Gravida  1   Para      Term      Preterm      AB      Living        SAB      TAB      Ectopic      Multiple      Live Births               Home Medications    Prior to Admission medications   Medication Sig Start Date End Date Taking? Authorizing Provider  cephALEXin (KEFLEX) 500 MG capsule Take 1 capsule (500 mg total) by mouth 2 (two) times daily for 10 days. 02/13/18 02/23/18  Harlene SaltsMorelli, Brandon A, PA-C  escitalopram (LEXAPRO) 20 MG tablet Take 20 mg by mouth daily.    [provider]  ondansetron (ZOFRAN ODT) 4 MG disintegrating tablet Take 1 tablet (4 mg total) by mouth every 8 (eight) hours as needed for nausea or vomiting. 02/13/18   Harlene SaltsMorelli, Brandon A, PA-C  phentermine (ADIPEX-P) 37.5 MG tablet TAKE 1 2 (ONE HALF) TABLET BY MOUTH ONCE DAILY 12/21/17   [provider]  topiramate (TOPAMAX) 25 MG tablet Take 25 mg by mouth daily. 12/27/17   [provider]  Burr MedicoXULANE 150-35 MCG/24HR transdermal patch APPLY 1 PATCH TOPICALLY ONTO  THE SKIN EVERY 7 DAYSNEEDS APPT FOR ANNUAL EXAM FOR ADDITIONAL REFILLS 01/28/18   [provider]    Family History No family history on file.  Social History Social History   Tobacco Use  . Smoking status: Current Every Day Smoker    Types: Cigarettes  . Smokeless tobacco: Never Used  Substance Use Topics  . Alcohol use: Yes    Comment: occ  . Drug use: No     Allergies   Amoxicillin; Morphine and related; and Strawberry extract   Review of Systems Review of Systems  Constitutional: Negative for chills.  Gastrointestinal: Negative for nausea and vomiting.  Genitourinary: Positive for dysuria, frequency and urgency. Negative for flank pain and hematuria.  All other systems reviewed and are negative.    Physical Exam Updated Vital Signs BP 122/64   Pulse 73   Temp 98.3 F (36.8 C)   Resp 16   Ht 5\' 2"  (1.575 m)   Wt 81.6 kg  (180 lb)   LMP 01/30/2018 (Approximate)   SpO2 98%   BMI 32.92 kg/m   Physical Exam  Constitutional: She is oriented to person, place, and time. She appears well-developed and well-nourished. No distress.  HENT:  Head: Normocephalic and atraumatic.  Mouth/Throat: Oropharynx is clear and moist.  Eyes: Pupils are equal, round, and reactive to light. Conjunctivae and EOM are normal.  Neck: Normal range of motion. Neck supple.  Cardiovascular: Normal rate, regular rhythm and intact distal pulses.  No murmur heard. Pulmonary/Chest: Effort normal and breath sounds normal. No respiratory distress. She has no wheezes. She has no rales.  Abdominal: Soft. She exhibits no distension. There is no tenderness. There is no rebound and no guarding.  No flank pain  Musculoskeletal: Normal range of motion. She exhibits no edema or tenderness.  Neurological: She is alert and oriented to person, place, and time.  Skin: Skin is warm and dry. No rash noted. No erythema.  Psychiatric: She has a normal mood and affect. Her behavior is normal.  Nursing note and vitals reviewed.    ED Treatments / Results  Labs (all labs ordered are listed, but only abnormal results are displayed) Labs Reviewed  URINALYSIS, ROUTINE W REFLEX MICROSCOPIC - Abnormal; Notable for the following components:      Result Value   Color, Urine ORANGE (*)    Specific Gravity, Urine <1.005 (*)    Glucose, UA 100 (*)    Nitrite POSITIVE (*)    All other components within normal limits  URINALYSIS, MICROSCOPIC (REFLEX) - Abnormal; Notable for the following components:   Bacteria, UA FEW (*)    All other components within normal limits    EKG None  Radiology No results found.  Procedures Procedures (including critical care time)  Medications Ordered in ED Medications - No data to display   Initial Impression / Assessment and Plan / ED Course  I have reviewed the triage vital signs and the nursing notes.  Pertinent  labs & imaging results that were available during my care of the patient were reviewed by me and considered in my medical decision making (see chart for details).     Patient presenting today with persistent urinary symptoms.  She is well-appearing without symptoms concerning for pyelonephritis.  Low suspicion for STD as she is low risk and sexually active with only her husband.  She denies any vaginal symptoms concerning for yeast infection.  She has frequent UTIs but no prior history of kidney stones.  Will recheck  urine.  Urine cultures done on 02/13/2018 showed less than 100,000 colonies and to few to culture.  Patient states she is taking Keflex in the past but it never affected her the way it did this time.  12:36 PM UA with nitrite positive still but improved WBC and RBC.  Will reculture.  Because patient is still symptomatic will give Cipro but recommended that she could even hold it to see if symptoms will just improve as she did states she had a history of interstitial cystitis.  Pt's UPT just 5 days ago was neg.  Final Clinical Impressions(s) / ED Diagnoses   Final diagnoses:  Dysuria    ED Discharge Orders        Ordered    ciprofloxacin (CIPRO) 500 MG tablet  2 times daily     02/18/18 1237       Gwyneth Sprout, MD 02/18/18 1237

## 2018-02-18 NOTE — ED Triage Notes (Signed)
Pt states she is still having dysuria even after taking keflex.  Pt states the keflex was making her nauseated and dizzy, so she quit taking it yesterday.  No known fever but does have burning, urgency and frequency.

## 2020-09-22 ENCOUNTER — Inpatient Hospital Stay (HOSPITAL_BASED_OUTPATIENT_CLINIC_OR_DEPARTMENT_OTHER)
Admission: EM | Admit: 2020-09-22 | Discharge: 2020-09-25 | DRG: 872 | Disposition: A | Discharge status: Home / Self Care | Payer: BLUE CROSS/BLUE SHIELD | Attending: Internal Medicine | Admitting: Internal Medicine

## 2020-09-22 ENCOUNTER — Emergency Department (HOSPITAL_BASED_OUTPATIENT_CLINIC_OR_DEPARTMENT_OTHER): Payer: BLUE CROSS/BLUE SHIELD

## 2020-09-22 ENCOUNTER — Other Ambulatory Visit: Payer: Self-pay

## 2020-09-22 ENCOUNTER — Encounter (HOSPITAL_BASED_OUTPATIENT_CLINIC_OR_DEPARTMENT_OTHER): Payer: Self-pay | Admitting: Emergency Medicine

## 2020-09-22 DIAGNOSIS — Z79899 Other long term (current) drug therapy: Secondary | ICD-10-CM

## 2020-09-22 DIAGNOSIS — Z8616 Personal history of COVID-19: Secondary | ICD-10-CM | POA: Diagnosis not present

## 2020-09-22 DIAGNOSIS — Z91018 Allergy to other foods: Secondary | ICD-10-CM | POA: Diagnosis not present

## 2020-09-22 DIAGNOSIS — E669 Obesity, unspecified: Secondary | ICD-10-CM | POA: Diagnosis present

## 2020-09-22 DIAGNOSIS — F17210 Nicotine dependence, cigarettes, uncomplicated: Secondary | ICD-10-CM | POA: Diagnosis present

## 2020-09-22 DIAGNOSIS — F32A Depression, unspecified: Secondary | ICD-10-CM | POA: Diagnosis present

## 2020-09-22 DIAGNOSIS — Z880 Allergy status to penicillin: Secondary | ICD-10-CM

## 2020-09-22 DIAGNOSIS — N3001 Acute cystitis with hematuria: Secondary | ICD-10-CM | POA: Diagnosis present

## 2020-09-22 DIAGNOSIS — N10 Acute tubulo-interstitial nephritis: Secondary | ICD-10-CM | POA: Diagnosis present

## 2020-09-22 DIAGNOSIS — A419 Sepsis, unspecified organism: Secondary | ICD-10-CM | POA: Diagnosis not present

## 2020-09-22 DIAGNOSIS — R079 Chest pain, unspecified: Secondary | ICD-10-CM | POA: Diagnosis present

## 2020-09-22 DIAGNOSIS — Z6836 Body mass index (BMI) 36.0-36.9, adult: Secondary | ICD-10-CM

## 2020-09-22 DIAGNOSIS — N12 Tubulo-interstitial nephritis, not specified as acute or chronic: Secondary | ICD-10-CM | POA: Diagnosis present

## 2020-09-22 DIAGNOSIS — Z885 Allergy status to narcotic agent status: Secondary | ICD-10-CM | POA: Diagnosis not present

## 2020-09-22 DIAGNOSIS — E876 Hypokalemia: Secondary | ICD-10-CM | POA: Diagnosis present

## 2020-09-22 DIAGNOSIS — Z9049 Acquired absence of other specified parts of digestive tract: Secondary | ICD-10-CM

## 2020-09-22 DIAGNOSIS — F909 Attention-deficit hyperactivity disorder, unspecified type: Secondary | ICD-10-CM | POA: Diagnosis present

## 2020-09-22 DIAGNOSIS — N390 Urinary tract infection, site not specified: Secondary | ICD-10-CM | POA: Diagnosis not present

## 2020-09-22 LAB — CBC WITH DIFFERENTIAL/PLATELET
Abs Immature Granulocytes: 0.12 10*3/uL — ABNORMAL HIGH (ref 0.00–0.07)
Basophils Absolute: 0 10*3/uL (ref 0.0–0.1)
Basophils Relative: 0 %
Eosinophils Absolute: 0.1 10*3/uL (ref 0.0–0.5)
Eosinophils Relative: 0 %
HCT: 43.2 % (ref 36.0–46.0)
Hemoglobin: 14.2 g/dL (ref 12.0–15.0)
Immature Granulocytes: 1 %
Lymphocytes Relative: 3 %
Lymphs Abs: 0.4 10*3/uL — ABNORMAL LOW (ref 0.7–4.0)
MCH: 28.5 pg (ref 26.0–34.0)
MCHC: 32.9 g/dL (ref 30.0–36.0)
MCV: 86.7 fL (ref 80.0–100.0)
Monocytes Absolute: 0.4 10*3/uL (ref 0.1–1.0)
Monocytes Relative: 2 %
Neutro Abs: 15.9 10*3/uL — ABNORMAL HIGH (ref 1.7–7.7)
Neutrophils Relative %: 94 %
Platelets: 329 10*3/uL (ref 150–400)
RBC: 4.98 MIL/uL (ref 3.87–5.11)
RDW: 13.4 % (ref 11.5–15.5)
WBC: 16.9 10*3/uL — ABNORMAL HIGH (ref 4.0–10.5)
nRBC: 0 % (ref 0.0–0.2)

## 2020-09-22 LAB — BASIC METABOLIC PANEL
Anion gap: 10 (ref 5–15)
BUN: 9 mg/dL (ref 6–20)
CO2: 25 mmol/L (ref 22–32)
Calcium: 9.2 mg/dL (ref 8.9–10.3)
Chloride: 106 mmol/L (ref 98–111)
Creatinine, Ser: 0.97 mg/dL (ref 0.44–1.00)
GFR, Estimated: >60 mL/min (ref >60)
Glucose, Bld: 123 mg/dL — ABNORMAL HIGH (ref 70–99)
Potassium: 3.6 mmol/L (ref 3.5–5.1)
Sodium: 141 mmol/L (ref 135–145)

## 2020-09-22 LAB — RESP PANEL BY RT-PCR (FLU A&B, COVID) ARPGX2
Influenza A by PCR: NEGATIVE
Influenza B by PCR: NEGATIVE
SARS Coronavirus 2 by RT PCR: POSITIVE — AB

## 2020-09-22 LAB — URINALYSIS, ROUTINE W REFLEX MICROSCOPIC
Bilirubin Urine: NEGATIVE
Glucose, UA: NEGATIVE mg/dL
Ketones, ur: NEGATIVE mg/dL
Nitrite: POSITIVE — AB
Protein, ur: NEGATIVE mg/dL
Specific Gravity, Urine: <1.005 — ABNORMAL LOW (ref 1.005–1.030)
pH: 5.5 (ref 5.0–8.0)

## 2020-09-22 LAB — HEPATIC FUNCTION PANEL
ALT: 16 U/L (ref 0–44)
AST: 22 U/L (ref 15–41)
Albumin: 3.8 g/dL (ref 3.5–5.0)
Alkaline Phosphatase: 60 U/L (ref 38–126)
Bilirubin, Direct: <0.1 mg/dL (ref 0.0–0.2)
Total Bilirubin: 0.3 mg/dL (ref 0.3–1.2)
Total Protein: 6.3 g/dL — ABNORMAL LOW (ref 6.5–8.1)

## 2020-09-22 LAB — PROTIME-INR
INR: 1 (ref 0.8–1.2)
Prothrombin Time: 12.6 seconds (ref 11.4–15.2)

## 2020-09-22 LAB — TROPONIN I (HIGH SENSITIVITY)
Troponin I (High Sensitivity): <2 ng/L (ref <18)
Troponin I (High Sensitivity): 2 ng/L (ref <18)

## 2020-09-22 LAB — LACTIC ACID, PLASMA
Lactic Acid, Venous: 2.1 mmol/L (ref 0.5–1.9)
Lactic Acid, Venous: 2.6 mmol/L (ref 0.5–1.9)
Lactic Acid, Venous: 2 mmol/L (ref 0.5–1.9)

## 2020-09-22 LAB — APTT: aPTT: 32 seconds (ref 24–36)

## 2020-09-22 LAB — URINALYSIS, MICROSCOPIC (REFLEX): RBC / HPF: >50 RBC/hpf (ref 0–5)

## 2020-09-22 LAB — HCG, QUANTITATIVE, PREGNANCY: hCG, Beta Chain, Quant, S: <1 m[IU]/mL (ref <5)

## 2020-09-22 MED ORDER — ACETAMINOPHEN 325 MG PO TABS
650.0000 mg | ORAL_TABLET | Freq: Four times a day (QID) | ORAL | Status: DC | PRN
  Administered 2020-09-22: 650 mg via ORAL
  Filled 2020-09-22: qty 2

## 2020-09-22 MED ORDER — FENTANYL CITRATE (PF) 100 MCG/2ML IJ SOLN
50.0000 ug | Freq: Once | INTRAMUSCULAR | Status: AC
  Administered 2020-09-22: 50 ug via INTRAVENOUS
  Filled 2020-09-22: qty 2

## 2020-09-22 MED ORDER — VANCOMYCIN HCL IN DEXTROSE 1-5 GM/200ML-% IV SOLN
1000.0000 mg | Freq: Once | INTRAVENOUS | Status: AC
  Administered 2020-09-22: 1000 mg via INTRAVENOUS
  Filled 2020-09-22: qty 200

## 2020-09-22 MED ORDER — ONDANSETRON HCL 4 MG PO TABS: 4.0000 mg | ORAL_TABLET | Freq: Four times a day (QID) | ORAL | Status: DC | PRN

## 2020-09-22 MED ORDER — ENOXAPARIN SODIUM 40 MG/0.4ML ~~LOC~~ SOLN
40.0000 mg | SUBCUTANEOUS | Status: DC
  Administered 2020-09-22 – 2020-09-24 (×3): 40 mg via SUBCUTANEOUS
  Filled 2020-09-22 (×3): qty 0.4

## 2020-09-22 MED ORDER — ONDANSETRON HCL 4 MG/2ML IJ SOLN
4.0000 mg | Freq: Once | INTRAMUSCULAR | Status: AC
  Administered 2020-09-22: 4 mg via INTRAVENOUS
  Filled 2020-09-22: qty 2

## 2020-09-22 MED ORDER — ACETAMINOPHEN 325 MG PO TABS
650.0000 mg | ORAL_TABLET | Freq: Once | ORAL | Status: AC
  Administered 2020-09-22: 650 mg via ORAL
  Filled 2020-09-22: qty 2

## 2020-09-22 MED ORDER — LACTATED RINGERS IV SOLN: INTRAVENOUS | Status: DC

## 2020-09-22 MED ORDER — LACTATED RINGERS IV BOLUS
1000.0000 mL | Freq: Once | INTRAVENOUS | Status: AC
  Administered 2020-09-22: 1000 mL via INTRAVENOUS

## 2020-09-22 MED ORDER — ACETAMINOPHEN 650 MG RE SUPP: 650.0000 mg | Freq: Four times a day (QID) | RECTAL | Status: DC | PRN

## 2020-09-22 MED ORDER — ONDANSETRON HCL 4 MG/2ML IJ SOLN: 4.0000 mg | Freq: Four times a day (QID) | INTRAMUSCULAR | Status: DC | PRN

## 2020-09-22 MED ORDER — SENNOSIDES-DOCUSATE SODIUM 8.6-50 MG PO TABS: 1.0000 | ORAL_TABLET | Freq: Every evening | ORAL | Status: DC | PRN

## 2020-09-22 MED ORDER — LACTATED RINGERS IV BOLUS (SEPSIS)
600.0000 mL | Freq: Once | INTRAVENOUS | Status: AC
  Administered 2020-09-22: 600 mL via INTRAVENOUS

## 2020-09-22 MED ORDER — IBUPROFEN 600 MG PO TABS
600.0000 mg | ORAL_TABLET | Freq: Four times a day (QID) | ORAL | Status: DC | PRN
  Administered 2020-09-23 – 2020-09-24 (×4): 600 mg via ORAL
  Filled 2020-09-22 (×4): qty 1

## 2020-09-22 MED ORDER — SODIUM CHLORIDE 0.9 % IV SOLN
2.0000 g | INTRAVENOUS | Status: DC
  Administered 2020-09-22: 2 g via INTRAVENOUS
  Filled 2020-09-22: qty 20

## 2020-09-22 MED ORDER — VANCOMYCIN HCL 1.25 G IV SOLR
1250.0000 mg | INTRAVENOUS | Status: DC
  Filled 2020-09-22: qty 1250

## 2020-09-22 MED ORDER — PIPERACILLIN-TAZOBACTAM 3.375 G IVPB
3.3750 g | Freq: Three times a day (TID) | INTRAVENOUS | Status: DC
  Administered 2020-09-22: 3.375 g via INTRAVENOUS
  Filled 2020-09-22 (×3): qty 50

## 2020-09-22 MED ORDER — IOHEXOL 350 MG/ML SOLN
100.0000 mL | Freq: Once | INTRAVENOUS | Status: AC | PRN
  Administered 2020-09-22: 100 mL via INTRAVENOUS

## 2020-09-22 NOTE — Progress Notes (Signed)
Pharmacy Antibiotic Note  Amy Lutz is a 30 y.o. female admitted on 09/22/2020 with sepsis.  Pharmacy has been consulted for vancomycin & zosyn dosing.  Plan: Vancomycin 1250mg  IV every 24 hours.  Goal  - AUC ~ 500 mcg*h/mL Zosyn 3.375g IV q8h (4 hour infusion).  Height: 5\' 2"  (157.5 cm) Weight: 89.8 kg (198 lb) IBW/kg (Calculated) : 50.1  Temp (24hrs), Avg:99.9 F (37.7 C), Min:98.8 F (37.1 C), Max:101 F (38.3 C)  Recent Labs  Lab 09/22/20 1411 09/22/20 1431  WBC 16.9*  --   CREATININE 0.97  --   LATICACIDVEN  --  2.1*    Estimated Creatinine Clearance: 88.4 mL/min (by C-G formula based on SCr of 0.97 mg/dL).    Allergies  Allergen Reactions  . Amoxicillin Nausea And Vomiting  . Morphine And Related Other (See Comments)    Increases pain  . Strawberry Extract Hives    Antimicrobials this admission: Vancomycin 1 gram IV x1 3/9 Zosyn 3.375 gm IV x1 3/9  Microbiology results: pending  Thank you for allowing pharmacy to be a part of this patient's care.  5/9 A Darcy Cordner 09/22/2020 3:35 PM

## 2020-09-22 NOTE — Sepsis Progress Note (Signed)
Notified bedside nurse of need to draw blood cultures.  

## 2020-09-22 NOTE — Progress Notes (Signed)
Pt arrived to the unit stable, experiencing 7/10 pain but calm, AOx4. Texted MD for admit orders and pain meds and regarding soft BP. No response yet.

## 2020-09-22 NOTE — Progress Notes (Signed)
CRITICAL VALUE STICKER  CRITICAL VALUE: 2 LACTIC ACID  RECEIVER (on-site recipient of call):L JEAN RN  DATE & TIME NOTIFIED: 09/22/20 1850  MESSENGER (representative from lab):  MD NOTIFIED: SHALOUB  TIME OF NOTIFICATION: 1855  RESPONSE: AWAITING RESPONSE

## 2020-09-22 NOTE — Sepsis Progress Note (Signed)
Notified provider of need to order repeat lactic acid. ° °

## 2020-09-22 NOTE — Sepsis Progress Note (Signed)
Notified bedside nurse of need to draw repeat lactic acid after the fluid bolus is complete.

## 2020-09-22 NOTE — ED Provider Notes (Signed)
[facility] EMERGENCY DEPARTMENT Provider Note   CSN: [id number] Arrival date & time: 09/22/20  1357     History Chief Complaint  Patient presents with  . Chest Pain    Amy Lutz is a 30 y.o. female who presents with concern for severe 10/10 chest pain that started suddenly while she was lying in her bed around 1130 this morning.  She has associated shortness of breath.  She states she was feeling normal yesterday and this morning was feeling well.  She states she laid down in bed this morning to relax and watching television when her symptoms started suddenly.  Pain is in her lower chest and crosses diaphragm into the upper abdomen.  She states it radiates through to her upper back.  She endorses family history of aortic aneurysms.  History is provided by the patient, though collateral history is provided by her husband who is at the bedside due to acuity of patient's discomfort.  He states she is currently taking leftover Macrobid pills at home for dysuria and urinary frequency.  She states has been taking 2 days this medication. Denies fevers/chills at home.  Personal history of COVID-19 infection in 07/2020, she is not vaccinated against COVID-19.  History of ADHD, depression.  Patient states she is on Nexplanon for contraception.  She is having periods, LMP unknown at this time.  LEVEL 5 CAVEAT due to acuity of patient's condition.   HPI     Past Medical History:  Diagnosis Date  . ADHD (attention deficit hyperactivity disorder)   . Anxiety   . Depression   . Miscarriage   . Pyelonephritis     Patient Active Problem List   Diagnosis Date Noted  . Sepsis secondary to UTI (HCC) 09/22/2020    Past Surgical History:  Procedure Laterality Date  . CHOLECYSTECTOMY    . KNEE ARTHROSCOPY    . OVARIAN CYST SURGERY    . WISDOM TOOTH EXTRACTION       OB History    Gravida  1   Para      Term      Preterm      AB      Living        SAB      IAB       Ectopic      Multiple      Live Births              No family history on file.  Social History   Tobacco Use  . Smoking status: Current Every Day Smoker    Types: Cigarettes  . Smokeless tobacco: Never Used  Substance Use Topics  . Alcohol use: Yes    Comment: occ  . Drug use: No    Home Medications Prior to Admission medications   Medication Sig Start Date End Date Taking? Authorizing Provider  ciprofloxacin (CIPRO) 500 MG tablet Take 1 tablet (500 mg total) by mouth 2 (two) times daily. 02/18/18   Gwyneth Sprout, MD  escitalopram (LEXAPRO) 20 MG tablet Take 20 mg by mouth daily.    [provider]  ondansetron (ZOFRAN ODT) 4 MG disintegrating tablet Take 1 tablet (4 mg total) by mouth every 8 (eight) hours as needed for nausea or vomiting. 02/13/18   Harlene Salts A, PA-C  phentermine (ADIPEX-P) 37.5 MG tablet TAKE 1 2 (ONE HALF) TABLET BY MOUTH ONCE DAILY 12/21/17   [provider]  topiramate (TOPAMAX) 25 MG tablet Take 25 mg by  mouth daily. 12/27/17   [provider]  [employer] 150-35 MCG/24HR transdermal patch APPLY 1 PATCH TOPICALLY ONTO THE SKIN EVERY 7 DAYSNEEDS APPT FOR ANNUAL EXAM FOR ADDITIONAL REFILLS 01/28/18   [provider]    Allergies    Amoxicillin, Morphine and related, and Strawberry extract  Review of Systems   Review of Systems  Unable to perform ROS: Acuity of condition  Respiratory: Positive for shortness of breath.   Cardiovascular: Positive for chest pain.    Physical Exam Updated Vital Signs BP (!) 111/51   Pulse (!) 116   Temp 99.4 F (37.4 C) (Oral)   Resp (!) 24   Ht  (1.575 m)   Wt 89.8 kg   SpO2 98%   BMI 36.21 kg/m   Physical Exam Vitals and nursing note reviewed.  Constitutional:      Appearance: She is obese. She is ill-appearing and diaphoretic.  HENT:     Head: Normocephalic and atraumatic.     Nose: Nose normal.     Mouth/Throat:     Mouth: Mucous membranes are moist.      Pharynx: Oropharynx is clear. Uvula midline. No oropharyngeal exudate or posterior oropharyngeal erythema.     Tonsils: No tonsillar exudate.  Eyes:     General: Lids are normal. Vision grossly intact.        Right eye: No discharge.        Left eye: No discharge.     Extraocular Movements: Extraocular movements intact.     Conjunctiva/sclera: Conjunctivae normal.     Pupils: Pupils are equal, round, and reactive to light.  Neck:     Trachea: Trachea and phonation normal.  Cardiovascular:     Rate and Rhythm: Regular rhythm. Tachycardia present.     Pulses: Normal pulses.          Radial pulses are 2+ on the right side and 2+ on the left side.       Dorsalis pedis pulses are 2+ on the right side and 2+ on the left side.     Heart sounds: Normal heart sounds.   No systolic murmur is present.   Pulmonary:     Effort: Pulmonary effort is normal. Tachypnea present. No respiratory distress.     Breath sounds: Examination of the right-lower field reveals decreased breath sounds. Examination of the left-lower field reveals decreased breath sounds. Decreased breath sounds present. No wheezing or rales.     Comments: On 3L by , O2 saturation 92%. Chest:     Chest wall: Tenderness present. No deformity or crepitus.  Abdominal:     General: Bowel sounds are normal. There is no distension.     Palpations: Abdomen is soft.     Tenderness: There is generalized abdominal tenderness and tenderness in the epigastric area.  Musculoskeletal:        General: No deformity. Normal range of motion.     Cervical back: Neck supple. No crepitus. No pain with movement, spinous process tenderness or muscular tenderness.     Right lower leg: No edema.     Left lower leg: No edema.  Lymphadenopathy:     Cervical: No cervical adenopathy.  Skin:    General: Skin is warm.     Capillary Refill: Capillary refill takes less than 2 seconds.  Neurological:     General: No focal deficit present.     Mental  Status: She is alert and oriented to person, place, and time.  Sensory: Sensation is intact.     Motor: Motor function is intact.  Psychiatric:        Mood and Affect: Mood is anxious. Affect is tearful.     ED Results / Procedures / Treatments   Labs (all labs ordered are listed, but only abnormal results are displayed) Labs Reviewed  RESP PANEL BY RT-PCR (FLU A&B, COVID) ARPGX2 - Abnormal; Notable for the following components:      Result Value   SARS Coronavirus 2 by RT PCR POSITIVE (*)    All other components within normal limits  BASIC METABOLIC PANEL - Abnormal; Notable for the following components:   Glucose, Bld 123 (*)    All other components within normal limits  LACTIC ACID, PLASMA - Abnormal; Notable for the following components:   Lactic Acid, Venous 2.1 (*)    All other components within normal limits  LACTIC ACID, PLASMA - Abnormal; Notable for the following components:   Lactic Acid, Venous 2.6 (*)    All other components within normal limits  CBC WITH DIFFERENTIAL/PLATELET - Abnormal; Notable for the following components:   WBC 16.9 (*)    Neutro Abs 15.9 (*)    Lymphs Abs 0.4 (*)    Abs Immature Granulocytes 0.12 (*)    All other components within normal limits  URINALYSIS, ROUTINE W REFLEX MICROSCOPIC - Abnormal; Notable for the following components:   Color, Urine ORANGE (*)    APPearance CLOUDY (*)    Specific Gravity, Urine <1.005 (*)    Hgb urine dipstick LARGE (*)    Nitrite POSITIVE (*)    Leukocytes,Ua TRACE (*)    All other components within normal limits  HEPATIC FUNCTION PANEL - Abnormal; Notable for the following components:   Total Protein 6.3 (*)    All other components within normal limits  URINALYSIS, MICROSCOPIC (REFLEX) - Abnormal; Notable for the following components:   Bacteria, UA FEW (*)    All other components within normal limits  URINE CULTURE  CULTURE, BLOOD (ROUTINE X 2)  CULTURE, BLOOD (ROUTINE X 2)  HCG, QUANTITATIVE,  PREGNANCY  PROTIME-INR  APTT  TROPONIN I (HIGH SENSITIVITY)  TROPONIN I (HIGH SENSITIVITY)    EKG EKG Interpretation  Date/Time:  Wednesday September 22 2020 14:09:40 EST Ventricular Rate:  120 PR Interval:    QRS Duration: 89 QT Interval:  311 QTC Calculation: 440 R Axis:   59 Text Interpretation: Sinus tachycardia Confirmed by Cherlynn PerchesKatz, Eric ([id number]) on 09/22/2020 3:09:16 PM   Radiology CT Angio Chest/Abd/Pel for Dissection W and/or W/WO  Result Date: 09/22/2020 CLINICAL DATA:  Reports sudden onset of cp that goes all the way across chest and radiates into upper back. Describes at 10/10 with SOB and nausea EXAM: CT ANGIOGRAPHY CHEST, ABDOMEN AND PELVIS TECHNIQUE: Non-contrast CT of the chest was initially obtained. Multidetector CT imaging through the chest, abdomen and pelvis was performed using the standard protocol during bolus administration of intravenous contrast. Multiplanar reconstructed images and MIPs were obtained and reviewed to evaluate the vascular anatomy. CONTRAST:  100mL OMNIPAQUE IOHEXOL 350 MG/ML SOLN COMPARISON:  CTA chest Dec 07, 2019. CT abdomen pelvis April 24, 2015. FINDINGS: CTA CHEST FINDINGS Cardiovascular: Preferential opacification of the thoracic aorta. No evidence of thoracic aortic aneurysm or dissection. No central pulmonary embolus. Normal heart size. No pericardial effusion. Mediastinum/Nodes: No enlarged mediastinal, hilar, or axillary lymph nodes. Thyroid gland, trachea, and esophagus demonstrate no significant findings. Lungs/Pleura: Lungs are clear. No pleural effusion or pneumothorax. Musculoskeletal: No chest wall  abnormality. No acute or significant osseous findings. Review of the MIP images confirms the above findings. CTA ABDOMEN AND PELVIS FINDINGS VASCULAR Aorta: Normal caliber aorta without aneurysm, dissection, vasculitis or significant stenosis. Celiac: Patent without evidence of aneurysm, dissection, vasculitis or significant stenosis. SMA: Patent  without evidence of aneurysm, dissection, vasculitis or significant stenosis. Renals: Both renal arteries are patent without evidence of aneurysm, dissection, vasculitis, fibromuscular dysplasia or significant stenosis. IMA: Patent without evidence of aneurysm, dissection, vasculitis or significant stenosis. Inflow: Patent without evidence of aneurysm, dissection, vasculitis or significant stenosis. Veins: No obvious venous abnormality within the limitations of this arterial phase study. Review of the MIP images confirms the above findings. NON-VASCULAR Hepatobiliary: No suspicious hepatic lesion. Gallbladder is surgically absent. No biliary ductal dilation. Pancreas: Unremarkable. No pancreatic ductal dilatation or surrounding inflammatory changes. Spleen: Normal in size without focal abnormality. Adrenals/Urinary Tract: Adrenal glands are unremarkable. Kidneys are normal, without renal calculi, focal lesion, or hydronephrosis. Bladder is unremarkable. Stomach/Bowel: Stomach is within normal limits. Appendix appears normal. No evidence of bowel wall thickening, distention, or inflammatory changes. Lymphatic: No pathologically enlarged abdominopelvic lymph nodes. Reproductive: Uterus and bilateral adnexa are unremarkable. Other: No ascites.  No abdominal wall hernia. Musculoskeletal: No acute osseous abnormality. Review of the MIP images confirms the above findings. IMPRESSION: 1. Negative for evidence of thoracic or abdominal aortic aneurysm or dissection. 2. No acute findings in the chest, abdomen, or pelvis. 3. Status post cholecystectomy. Electronically Signed   By: Maudry Mayhew MD   On: 09/22/2020 15:28    Procedures .Critical Care Performed by: Paris Lore, PA-C Authorized by: Paris Lore, PA-C   Critical care provider statement:    Critical care time (minutes):  45   Critical care was necessary to treat or prevent imminent or life-threatening deterioration of the following  conditions:  Sepsis   Critical care was time spent personally by me on the following activities:  Discussions with consultants, evaluation of patient's response to treatment, examination of patient, ordering and performing treatments and interventions, ordering and review of laboratory studies, ordering and review of radiographic studies, pulse oximetry, re-evaluation of patient's condition, obtaining history from patient or surrogate and review of old charts     Medications Ordered in ED Medications  lactated ringers infusion ( Intravenous New Bag/Given 09/22/20 1727)  piperacillin-tazobactam (ZOSYN) IVPB 3.375 g (3.375 g Intravenous New Bag/Given 09/22/20 1549)  Vancomycin (VANCOCIN) 1,250 mg in sodium chloride 0.9 % 250 mL IVPB (has no administration in time range)  lactated ringers bolus 1,000 mL (has no administration in time range)  fentaNYL (SUBLIMAZE) injection 50 mcg (50 mcg Intravenous Given 09/22/20 1433)  ondansetron (ZOFRAN) injection 4 mg (4 mg Intravenous Given 09/22/20 1433)  iohexol (OMNIPAQUE) 350 MG/ML injection 100 mL (100 mLs Intravenous Contrast Given 09/22/20 1451)  fentaNYL (SUBLIMAZE) injection 50 mcg (50 mcg Intravenous Given 09/22/20 1524)  lactated ringers bolus 1,000 mL (0 mLs Intravenous Stopped 09/22/20 1634)  lactated ringers bolus 600 mL (0 mLs Intravenous Stopped 09/22/20 1545)  vancomycin (VANCOCIN) IVPB 1000 mg/200 mL premix (0 mg Intravenous Stopped 09/22/20 1653)  ondansetron (ZOFRAN) injection 4 mg (4 mg Intravenous Given 09/22/20 1550)  acetaminophen (TYLENOL) tablet 650 mg (650 mg Oral Given 09/22/20 1547)  lactated ringers bolus 1,000 mL (0 mLs Intravenous Stopped 09/22/20 1727)    ED Course  I have reviewed the triage vital signs and the nursing notes.  Pertinent labs & imaging results that were available during my care of the patient were  reviewed by me and considered in my medical decision making (see chart for details).  Clinical Course as of 09/22/20 1757  Wed Sep 22, 2020  1500 Critical result, lactic elevated to 2.1. [RS]  1624 Consult to hospitalist, Dr. Denton Lank, who is agreeable to admitting this patient to her service at [facility] for urosepsis with COVID-19. I appreciate her collaboration in the care of this patient.  [RS]  1747 Increase in lactic acid to 2.6 after sepsis order set boluses.  [RS]    Clinical Course User Index [RS] Maite Burlison, Eugene Gavia, PA-C   MDM Rules/Calculators/A&P                         30 year old female presents with concern for sudden onset chest pain 10/10 that started at 1130 this morning with associated shortness of breath.  Patient is not vaccinated against COVID-19.  Differential diagnosis   Tachycardic to 125, borderline hypotensive and febrile to 101 F on intake.  Rigors at time of my exam.  Cardiac exam is with tachycardia, pulmonary exam revealed decreased breath sounds and rales in the right lung base.  Abdomen is soft and nondistended though tender in the epigastrium.  Patient moving all 4 extremities spontaneously without difficulty but does appear to be extremely uncomfortable.  Analgesia, antiemetic ordered.  Given pain was sudden in onset, crosses the diaphragm, and radiates to her back, she has a strong family history of aneurysms, will proceed with dissection study at this time. Will also proceed with broad laboratory work-up.  CBC with leukocytosis of 17,000, BMP unremarkable.  Lactic acid elevated 2.1.  Troponin negative.  CT angiogram negative for acute dissection, PE, or acute thoracic/abdominal abnormality. EKG reassuring.  Urinalysis is pending; will start broad-spectrum antibiotics and proceed with code sepsis order sets.  UA concerning for infection. Patient no longer requiring supplemental O2 at this time.   Given sepsis picture and tenuous hemodynamic status, this patient will require admission to the hospital.  Discussed disposition plan with the patient and her husband who is at the bedside.   They voiced understanding of her medical evaluation and treatment plan.  Each of their questions was answered to their expressed satisfaction.  They are amenable to plan for admission to the hospital at this time.  Further fluid bolus ordered for interval increase in lactic acid despite initial sepsis order set bolus.  Case discussed with hospitalist, Dr. Denton Lank at [facility], who is agreeable to excepting this patient.  She is stable for transport at this time.  Final Clinical Impression(s) / ED Diagnoses Final diagnoses:  None    Rx / DC Orders ED Discharge Orders    None       Sherrilee Gilles 09/22/20 1758    Sabino Donovan, MD 09/23/20 [phone]

## 2020-09-22 NOTE — Sepsis Progress Note (Signed)
elink monitoring code sepsis.  

## 2020-09-22 NOTE — H&P (Addendum)
History and Physical    Amy Lutz ZOX:[id number] DOB: 08-01-1990 DOA: 09/22/2020  PCP: Patient, No Pcp Per   [facility]  Chief Complaint: Chest pain  HPI: Amy GurneyHaley F Aho is a 30 y.o. female with medical history significant for ADHD, depression/anxiety who tented with complaint of chest pain.  She reports that she was laying in bed watching TV around 1130 this morning when she experienced chest pain that she described as 10 out of 10.  She reports it started in her mid to lower back and then she developed a chest pain that was a sharp stabbing pain and would radiate into her mid back.  She does not identify any factors that started the pain.  The chest pain is now resolved completely.  She does report having some mild shortness of breath of time the pain occurred.  Reports that for the last week to week and a half she has had dysuria and urinary frequency.  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  She reports she had some leftover Macrobid from a previous infection and took it for the last 3 days.  She reports she has had chills off and on for the last 2 days but has not had any fever.  She does report some pain in her right flank region off and on for the last few days which she has had before when she had pyelonephritis.  There is no family history of early cardiovascular disease.  States there is a history of aortic aneurysms in her family. Patient had Covid infection in mid January 2022.  States he was sick for approximately 6 days with cough, fever, chills, shortness of breath.  The symptoms are completely resolved and she does not feel like she did when she had Covid a few months ago.  She was retested for Covid in the emergency room for admission screening and was positive.  Is positive as is most likely secondary to previous Covid infection in January.  She has no symptoms of COVID.  Her CT angiography does not show any  groundglass opacities or signs of Covid infection.  Patient for COVID-19 but she had Covid a few months ago and would now have natural immunity  ED Course: In the emergency room she is found of elevated lactic acid level of 2.1 that increased to 2.6 when retested a few hours later.  Troponin was negative x2.  EKG showed no ischemic changes.  CT angiography did not reveal PE or aortic dissection.  Review of Systems:  General: Denies weakness, weight loss, night sweats.  Denies dizziness.  HENT: Denies head trauma, headache, denies change in hearing, tinnitus.  Denies nasal congestion or bleeding.  Denies sore throat, sores in mouth.  Denies difficulty swallowing Eyes: Denies blurry vision, pain in eye, drainage.  Denies discoloration of eyes. Neck: Denies pain.  Denies swelling.  Denies pain with movement. Cardiovascular: Reports chest pain. Denies palpitations.  Denies edema.  Denies orthopnea Respiratory: Reports shortness of breath this am which has resolved. Denies cough.  Denies wheezing.  Denies sputum production Gastrointestinal: Denies abdominal pain, swelling. Reports mild nausea but no vomiting, diarrhea.  Denies melena.  Denies hematemesis. Musculoskeletal: Reports mid to low back pain. Denies limitation of movement.  Denies deformity or swelling. Denies pain.  Genitourinary: Reports dysuria, right flank pain, urinary frequency. Denies pelvic pain.   Skin: Denies rash.  Denies petechiae, purpura, ecchymosis.  Neurological: Denies headache.  Denies syncope.  Denies seizure activity.  Denies paresthesia.  Denies slurred speech, drooping face.  Denies visual change. Psychiatric: Denies depression, anxiety.  Denies hallucinations.  Past Medical History:  Diagnosis Date  . ADHD (attention deficit hyperactivity disorder)   . Anxiety   . Depression   . Miscarriage   . Pyelonephritis     Past Surgical History:  Procedure Laterality Date  . CHOLECYSTECTOMY    . KNEE ARTHROSCOPY    .  OVARIAN CYST SURGERY    . WISDOM TOOTH EXTRACTION      Social History  reports that she has been smoking cigarettes. She has never used smokeless tobacco. She reports current alcohol use. She reports that she does not use drugs.  Allergies  Allergen Reactions  . Amoxicillin Nausea And Vomiting  . Morphine And Related Other (See Comments)    Increases pain  . Strawberry Extract Hives    History reviewed. No pertinent family history.   Prior to Admission medications   Medication Sig Start Date End Date Taking? Authorizing Provider  ciprofloxacin (CIPRO) 500 MG tablet Take 1 tablet (500 mg total) by mouth 2 (two) times daily. 02/18/18   Gwyneth Sprout, MD  escitalopram (LEXAPRO) 20 MG tablet Take 20 mg by mouth daily.    [provider]  ondansetron (ZOFRAN ODT) 4 MG disintegrating tablet Take 1 tablet (4 mg total) by mouth every 8 (eight) hours as needed for nausea or vomiting. 02/13/18   Harlene Salts A, PA-C  phentermine (ADIPEX-P) 37.5 MG tablet TAKE 1 2 (ONE HALF) TABLET BY MOUTH ONCE DAILY 12/21/17   [provider]  topiramate (TOPAMAX) 25 MG tablet Take 25 mg by mouth daily. 12/27/17   [provider]  [facility]r [employer]ico 150-35 MCG/24HR transdermal patch APPLY 1 PATCH TOPICALLY ONTO THE SKIN EVERY 7 DAYSNEEDS APPT FOR ANNUAL EXAM FOR ADDITIONAL REFILLS 01/28/18   [provider]    Physical Exam: Vitals:   09/22/20 1510 09/22/20 1622 09/22/20 1718 09/22/20 1851  BP: (!) 105/54 (!) 111/51  (!) 116/37  Pulse: (!) 120 (!) 116  99  Resp: (!) 25 (!) 24  17  Temp:   99.4 F (37.4 C) 98.9 F (37.2 C)  TempSrc:   Oral   SpO2: 98% 98%  96%  Weight:      Height:        Constitutional: NAD, calm, comfortable Vitals:   09/22/20 1510 09/22/20 1622 09/22/20 1718 09/22/20 1851  BP: (!) 105/54 (!) 111/51  (!) 116/37  Pulse: (!) 120 (!) 116  99  Resp: (!) 25 (!) 24  17  Temp:   99.4 F (37.4 C) 98.9 F (37.2 C)  TempSrc:   Oral   SpO2: 98% 98%  96%   Weight:      Height:       General: WDWN, Alert and oriented x3.  Eyes: EOMI, PERRL, conjunctivae normal.  Sclera nonicteric HENT:  [place]/AT, external ears normal.  Nares patent without epistasis.  Mucous membranes are moist. Posterior pharynx clear of any exudate or lesions. Neck: Soft, normal range of motion, supple, no masses, no thyromegaly. Trachea midline Respiratory: clear to auscultation bilaterally, no wheezing, no crackles. Normal respiratory effort. No accessory muscle use.  Cardiovascular: Regular rate and rhythm, no murmurs / rubs / gallops. No extremity edema. 2+ pedal pulses.  Abdomen: Soft, no tenderness, nondistended, Obese. no rebound or guarding. No masses palpated. Bowel sounds normoactive. Right flank pain to percussion Musculoskeletal: FROM. no clubbing /  cyanosis. Normal muscle tone.  Skin: Warm, dry, intact no rashes, lesions, ulcers. No induration Neurologic: CN 2-12 grossly intact.  Normal speech.  Sensation intact. Strength 5/5 in all extremities.   Psychiatric: Normal judgment and insight.  Normal mood.    Labs on Admission: I have personally reviewed following labs and imaging studies  CBC: Recent Labs  Lab 09/22/20 1411  WBC 16.9*  NEUTROABS 15.9*  HGB 14.2  HCT 43.2  MCV 86.7  PLT 329    Basic Metabolic Panel: Recent Labs  Lab 09/22/20 1411  NA 141  K 3.6  CL 106  CO2 25  GLUCOSE 123*  BUN 9  CREATININE 0.97  CALCIUM 9.2    GFR: Estimated Creatinine Clearance: 88.4 mL/min (by C-G formula based on SCr of 0.97 mg/dL).  Liver Function Tests: Recent Labs  Lab 09/22/20 1411  AST 22  ALT 16  ALKPHOS 60  BILITOT 0.3  PROT 6.3*  ALBUMIN 3.8    Urine analysis:    Component Value Date/Time   COLORURINE ORANGE (A) 09/22/2020 1539   APPEARANCEUR CLOUDY (A) 09/22/2020 1539   LABSPEC <1.005 (L) 09/22/2020 1539   PHURINE 5.5 09/22/2020 1539   GLUCOSEU NEGATIVE 09/22/2020 1539   HGBUR LARGE (A) 09/22/2020 1539   BILIRUBINUR NEGATIVE  09/22/2020 1539   KETONESUR NEGATIVE 09/22/2020 1539   PROTEINUR NEGATIVE 09/22/2020 1539   UROBILINOGEN 0.2 11/01/2011 0738   NITRITE POSITIVE (A) 09/22/2020 1539   LEUKOCYTESUR TRACE (A) 09/22/2020 1539    Radiological Exams on Admission: CT Angio Chest/Abd/Pel for Dissection W and/or W/WO  Result Date: 09/22/2020 CLINICAL DATA:  Reports sudden onset of cp that goes all the way across chest and radiates into upper back. Describes at 10/10 with SOB and nausea EXAM: CT ANGIOGRAPHY CHEST, ABDOMEN AND PELVIS TECHNIQUE: Non-contrast CT of the chest was initially obtained. Multidetector CT imaging through the chest, abdomen and pelvis was performed using the standard protocol during bolus administration of intravenous contrast. Multiplanar reconstructed images and MIPs were obtained and reviewed to evaluate the vascular anatomy. CONTRAST:  OMNIPAQUE IOHEXOL 350 MG/ML SOLN COMPARISON:  CTA chest Dec 07, 2019. CT abdomen pelvis April 24, 2015. FINDINGS: CTA CHEST FINDINGS Cardiovascular: Preferential opacification of the thoracic aorta. No evidence of thoracic aortic aneurysm or dissection. No central pulmonary embolus. Normal heart size. No pericardial effusion. Mediastinum/Nodes: No enlarged mediastinal, hilar, or axillary lymph nodes. Thyroid gland, trachea, and esophagus demonstrate no significant findings. Lungs/Pleura: Lungs are clear. No pleural effusion or pneumothorax. Musculoskeletal: No chest wall abnormality. No acute or significant osseous findings. Review of the MIP images confirms the above findings. CTA ABDOMEN AND PELVIS FINDINGS VASCULAR Aorta: Normal caliber aorta without aneurysm, dissection, vasculitis or significant stenosis. Celiac: Patent without evidence of aneurysm, dissection, vasculitis or significant stenosis. SMA: Patent without evidence of aneurysm, dissection, vasculitis or significant stenosis. Renals: Both renal arteries are patent without evidence of aneurysm,  dissection, vasculitis, fibromuscular dysplasia or significant stenosis. IMA: Patent without evidence of aneurysm, dissection, vasculitis or significant stenosis. Inflow: Patent without evidence of aneurysm, dissection, vasculitis or significant stenosis. Veins: No obvious venous abnormality within the limitations of this arterial phase study. Review of the MIP images confirms the above findings. NON-VASCULAR Hepatobiliary: No suspicious hepatic lesion. Gallbladder is surgically absent. No biliary ductal dilation. Pancreas: Unremarkable. No pancreatic ductal dilatation or surrounding inflammatory changes. Spleen: Normal in size without focal abnormality. Adrenals/Urinary Tract: Adrenal glands are unremarkable. Kidneys are normal, without renal calculi, focal lesion, or hydronephrosis. Bladder is unremarkable. Stomach/Bowel:  Stomach is within normal limits. Appendix appears normal. No evidence of bowel wall thickening, distention, or inflammatory changes. Lymphatic: No pathologically enlarged abdominopelvic lymph nodes. Reproductive: Uterus and bilateral adnexa are unremarkable. Other: No ascites.  No abdominal wall hernia. Musculoskeletal: No acute osseous abnormality. Review of the MIP images confirms the above findings. IMPRESSION: 1. Negative for evidence of thoracic or abdominal aortic aneurysm or dissection. 2. No acute findings in the chest, abdomen, or pelvis. 3. Status post cholecystectomy. Electronically Signed   By: Maudry Mayhew MD   On: 09/22/2020 15:28    EKG: Independently reviewed.  EKG shows sinus tachycardia with no ST elevation or depression.  No signs of ischemia.  QTc 440  Assessment/Plan Principal Problem:   Acute pyelonephritis Ms. Cloninger is admitted to medical telemetry for further monitoring.  She was given dose of vancomycin and Zosyn in the emergency room for about coverage.  With her having history of pyelonephritis with some right flank pain on exam with UTI will treat with  Rocephin for pyelonephritis.  Will be given 2 g every 24 hours while blood cultures are pending.  If bacteremia is not identified will scale down antibiotic to Rocephin 1 g Tylenol and Motrin as needed for pain and fever.  Active Problems:   Sepsis secondary to UTI [facility]) Meets sepsis criteria by tachycardia, fever, elevated lactic acid level greater than 2.  Patient has no signs of endorgan damage and has not been hypotensive.  She has received IV fluid bolus in the emergency room.  Will be continued on IV fluid with LR.  Recheck lactic acid level.     Chest pain Pt with no ischemic changes on EKG. No signs of aortic dissection on CTA chest. Troponin level negative x 2 in ER. Recheck third level now to make sure no change in troponin to be complete. Pt low risk of CVD.   Patient was Covid positive at end of January 2022 and has not 90 days since her infection.  Covid positive test is most likely secondary to her previous Covid infection still showing a positive on PCR testing.  She has no signs symptoms of Covid infection.  She has no groundglass opacities on CT angiography of her chest to suggest Covid infection or other viral infection.  No need for Covid isolation at this time.     DVT prophylaxis: Lovenox for DVT prophylaxis Code Status:   Full code    Family Communication:  Diagnosis and plan discussed with patient.  Patient verbalized understanding agrees with plan.  Questions answered.  Further recommendations to follow as clinical indicated Disposition Plan:   Patient is from:  Home  Anticipated DC to:  Home  Anticipated DC date:  Anticipate 2 midnight stay in the hospital to treat acute condition  Anticipated DC barriers: Barriers to discharge identified at this time  Admission status:  Inpatient   Claudean Severance Dirck Butch MD [facility]  How to contact the [facility] Attending or Consulting provider 7A - 7P or covering provider during after hours 7P -7A, for this patient?    1. Check the care team in [facility] and look for a) attending/consulting [facility] provider listed and b) the [facility] team listed 2. Log into [email or web address] and use [place]'s universal password to access. If you do not have the password, please contact the hospital operator. 3. Locate the [facility] provider you are looking for under [facility] and page to a number that you can be directly reached. 4. If you still have  difficulty reaching the provider, please page the [facility] (Director on Call) for the Hospitalists listed on amion for assistance.  09/22/2020, 7:59 PM

## 2020-09-22 NOTE — ED Triage Notes (Signed)
Reports sudden onset of cp that goes all the way across chest and radiates into upper back.  Describes at 10/10 with SOB and nausea.  Started two hours ago.

## 2020-09-22 NOTE — Plan of Care (Signed)

## 2020-09-22 NOTE — Sepsis Progress Note (Signed)
Notified provider of need to order additional  fluid bolus. 

## 2020-09-23 DIAGNOSIS — A419 Sepsis, unspecified organism: Principal | ICD-10-CM

## 2020-09-23 DIAGNOSIS — N10 Acute tubulo-interstitial nephritis: Secondary | ICD-10-CM

## 2020-09-23 DIAGNOSIS — N390 Urinary tract infection, site not specified: Secondary | ICD-10-CM

## 2020-09-23 LAB — CBC
HCT: 38.5 % (ref 36.0–46.0)
Hemoglobin: 12.5 g/dL (ref 12.0–15.0)
MCH: 28.2 pg (ref 26.0–34.0)
MCHC: 32.5 g/dL (ref 30.0–36.0)
MCV: 86.9 fL (ref 80.0–100.0)
Platelets: 281 10*3/uL (ref 150–400)
RBC: 4.43 MIL/uL (ref 3.87–5.11)
RDW: 13.6 % (ref 11.5–15.5)
WBC: 16.1 10*3/uL — ABNORMAL HIGH (ref 4.0–10.5)
nRBC: 0 % (ref 0.0–0.2)

## 2020-09-23 LAB — URINE CULTURE: Culture: <10000 — AB

## 2020-09-23 LAB — BASIC METABOLIC PANEL
Anion gap: 7 (ref 5–15)
BUN: 6 mg/dL (ref 6–20)
CO2: 25 mmol/L (ref 22–32)
Calcium: 8.7 mg/dL — ABNORMAL LOW (ref 8.9–10.3)
Chloride: 106 mmol/L (ref 98–111)
Creatinine, Ser: 0.89 mg/dL (ref 0.44–1.00)
GFR, Estimated: >60 mL/min (ref >60)
Glucose, Bld: 98 mg/dL (ref 70–99)
Potassium: 3.3 mmol/L — ABNORMAL LOW (ref 3.5–5.1)
Sodium: 138 mmol/L (ref 135–145)

## 2020-09-23 LAB — LACTIC ACID, PLASMA: Lactic Acid, Venous: 1.2 mmol/L (ref 0.5–1.9)

## 2020-09-23 LAB — HIV ANTIBODY (ROUTINE TESTING W REFLEX): HIV Screen 4th Generation wRfx: NONREACTIVE

## 2020-09-23 LAB — PROCALCITONIN: Procalcitonin: 6.82 ng/mL

## 2020-09-23 MED ORDER — SODIUM CHLORIDE 0.9 % IV SOLN
2.0000 g | Freq: Three times a day (TID) | INTRAVENOUS | Status: DC
  Administered 2020-09-23 – 2020-09-25 (×7): 2 g via INTRAVENOUS
  Filled 2020-09-23 (×8): qty 2

## 2020-09-23 MED ORDER — POTASSIUM CHLORIDE CRYS ER 20 MEQ PO TBCR
40.0000 meq | EXTENDED_RELEASE_TABLET | Freq: Four times a day (QID) | ORAL | Status: AC
Start: 2020-09-23 — End: 2020-09-23
  Administered 2020-09-23 (×2): 40 meq via ORAL
  Filled 2020-09-23 (×2): qty 2

## 2020-09-23 NOTE — Progress Notes (Signed)
Pharmacy Antibiotic Note  Amy Lutz is a 30 y.o. female admitted on 09/22/2020 with sepsis secondary to acute pyelonephritis.  Pharmacy has been consulted for Cefepime dosing for broader coverage pending culture results.  Plan: Cefepime 2 g IV every 8 hours. Monitor clinical status, renal function and culture results daily.    Height: 5\' 2"  (157.5 cm) Weight: 89.8 kg (198 lb) IBW/kg (Calculated) : 50.1  Temp (24hrs), Avg:99.2 F (37.3 C), Min:98.2 F (36.8 C), Max:101 F (38.3 C)  Recent Labs  Lab 09/22/20 1411 09/22/20 1431 09/22/20 1539 09/22/20 1854 09/23/20 0249  WBC 16.9*  --   --   --  16.1*  CREATININE 0.97  --   --   --  0.89  LATICACIDVEN  --  2.1* 2.6* 2.0* 1.2    Estimated Creatinine Clearance: 96.3 mL/min (by C-G formula based on SCr of 0.89 mg/dL).    Allergies  Allergen Reactions  . Amoxicillin Nausea And Vomiting  . Morphine And Related Other (See Comments)    Increases pain  . Strawberry Extract Hives    Antimicrobials this admission: Zosyn 3/9 x1 Vancomycin 3/9 x1 Ceftriaxone 3/9>3/10 Cefepime 3/10>>  Dose adjustments this admission: n/a  Microbiology results: 3/9 BCx: sent  3/9 BCx: pending 3/9 UCx: sent  3/9  Covid: positive (likely 2/2 Covid + Jan/2022)    Flu negative  Thank you for allowing pharmacy to be a part of this patient's care. Feb/2022, RPh Clinical Pharmacist [phone] Please check AMION for all [facility] phone numbers After 10:00 PM, call Main Pharmacy [phone] 09/23/2020 1:50 PM

## 2020-09-23 NOTE — Progress Notes (Signed)
PROGRESS NOTE                                                                             PROGRESS NOTE                                                                                                                                                                                                             Patient Demographics:    Amy Lutz, is a 30 y.o. female, DOB - 03-29-1991, XBD:[id number]  Outpatient Primary MD for the patient is Patient, No Pcp Per    LOS - 1  Admit date - 09/22/2020    Chief Complaint  Patient presents with   Chest Pain       Brief Narrative   30 y.o. female with medical history significant for ADHD, depression/anxiety who  resented with complaints of chest pain, CT chest/abdomen/pelvis with no evidence of dissection or acute findings, her work-up was significant for sepsis due to UTI, she tested positive for COVID-19, but reported her initial infection was on 07/31/2020 on home test kit .    Subjective:    Amy Lutz today reports lower back pain, bilateral flank pain, denies any further dysuria, reports generalized weakness, denies any dyspnea.  .  Patient was seen and examined with the presence of her RN Rama as female chaperone    Assessment  & Plan :    Principal Problem:   Acute pyelonephritis Active Problems:   Sepsis secondary to UTI ([place])   Pyelonephritis  Sepsis due to acute cystitis/bilateral pyelonephritis. -Sepsis present on admission, leukocytosis, febrile, tachycardic with elevated lactic acid at 2.6. -This is in the setting of UTI with bilateral pyelonephritis (has bilateral CVA tenderness) -Follow-up blood cultures, urine cultures, will broaden her Rocephin to cefepime currently. -Lactic  acid normalized after IV  Hydration -To trend procalcitonin  Hypokalemia -Repleted, recheck in a.m.  Chest pain Pt with no ischemic changes on EKG. No signs of aortic dissection on CTA chest. Troponin  level negative x 2 in ER. Recheck third  level now to make sure no change in troponin to be complete. Pt low risk of CVD.   COVID-19 positive -Initial infection 07/31/2020, no indication for isolation currently, no indication for treatment as well.  SpO2: 95 % O2 Flow Rate (L/min): 2 L/min  Recent Labs  Lab 09/22/20 1411 09/22/20 1431 09/22/20 1539 09/22/20 1854 09/23/20 0249  WBC 16.9*  --   --   --  16.1*  PLT 329  --   --   --  281  PROCALCITON  --   --   --   --  6.82  AST 22  --   --   --   --   ALT 16  --   --   --   --   ALKPHOS 60  --   --   --   --   BILITOT 0.3  --   --   --   --   ALBUMIN 3.8  --   --   --   --   INR  --   --  1.0  --   --   LATICACIDVEN  --  2.1* 2.6* 2.0* 1.2  SARSCOV2NAA  --  POSITIVE*  --   --   --        ABG  No results found for: PHART, PCO2ART, PO2ART, HCO3, TCO2, ACIDBASEDEF, O2SAT        Condition - Extremely Guarded  Family Communication  :  None at bedside  Code Status :  full  Consults  :  none  Procedures  :  none   Disposition Plan  :    Status is: Inpatient  Remains inpatient appropriate because:IV treatments appropriate due to intensity of illness or inability to take PO   Dispo: The patient is from: Home              Anticipated d/c is to: Home              Patient currently is not medically stable to d/c.   Difficult to place patient No      DVT Prophylaxis  :  Lovenox   Lab Results  Component Value Date   PLT 281 09/23/2020    Diet :  Diet Order            Diet Heart Room service appropriate? Yes; Fluid consistency: Thin  Diet effective now                  Inpatient Medications  Scheduled Meds:  enoxaparin (LOVENOX) injection  40 mg Subcutaneous Q24H   Continuous Infusions:  ceFEPime (MAXIPIME) IV Stopped (09/23/20 1400)   lactated ringers 75 mL/hr at 09/23/20 0734   PRN Meds:.acetaminophen **OR** acetaminophen, ibuprofen, ondansetron **OR** ondansetron (ZOFRAN) IV,  senna-docusate  Antibiotics  :    Anti-infectives (From admission, onward)   Start     Dose/Rate Route Frequency Ordered Stop   09/23/20 1330  ceFEPIme (MAXIPIME) 2 g in sodium chloride 0.9 % 100 mL IVPB        2 g 200 mL/hr over 30 Minutes Intravenous Every 8 hours 09/23/20 1307     09/23/20 1200  Vancomycin (VANCOCIN) 1,250 mg in sodium chloride 0.9 % 250 mL IVPB  Status:  Discontinued        1,250 mg 166.7 mL/hr over 90 Minutes Intravenous Every 24 hours 09/22/20 1534 09/22/20 2042   09/22/20 2100  cefTRIAXone (ROCEPHIN) 2 g in sodium chloride 0.9 % 100 mL IVPB  Status:  Discontinued        2 g 200 mL/hr over 30 Minutes Intravenous Every 24 hours 09/22/20 2019 09/23/20 1302   09/22/20 1600  piperacillin-tazobactam (ZOSYN) IVPB 3.375 g  Status:  Discontinued        3.375 g 12.5 mL/hr over 240 Minutes Intravenous Every 8 hours 09/22/20 1526 09/22/20 2039   09/22/20 1530  vancomycin (VANCOCIN) IVPB 1000 mg/200 mL premix        1,000 mg 200 mL/hr over 60 Minutes Intravenous  Once 09/22/20 1526 09/22/20 1653        Romonda Parker M.D on 09/23/2020 at 3:02 PM  To page go to [email or web address]   [facility] -  Office  [phone]     Objective:   Vitals:   09/22/20 2019 09/23/20 0358 09/23/20 0844 09/23/20 1242  BP: (!) 97/48 (!) 115/59  (!) 111/49  Pulse: 93 73  71  Resp: 20 20  (!) 21  Temp: 98.2 F (36.8 C) 99.2 F (37.3 C)  98.8 F (37.1 C)  TempSrc: Axillary Oral  Oral  SpO2: 93% 92% 94% 95%  Weight:      Height:        Wt Readings from Last 3 Encounters:  09/22/20 89.8 kg  02/18/18 81.6 kg  02/13/18 81.6 kg     Intake/Output Summary (Last 24 hours) at 09/23/2020 1502 Last data filed at 09/23/2020 1400 Gross per 24 hour  Intake 4163.45 ml  Output --  Net 4163.45 ml     Physical Exam  Awake Alert, No new F.N deficits, Normal affect Symmetrical Chest wall movement, Good air movement bilaterally, CTAB RRR,No Gallops,Rubs or new Murmurs, No  Parasternal Heave +ve B.Sounds, Abd Soft, she has B/L CVA  tenderness, No rebound - guarding or rigidity. No Cyanosis, Clubbing or edema, No new Rash or bruise     Data Review:    CBC Recent Labs  Lab 09/22/20 1411 09/23/20 0249  WBC 16.9* 16.1*  HGB 14.2 12.5  HCT 43.2 38.5  PLT 329 281  MCV 86.7 86.9  MCH 28.5 28.2  MCHC 32.9 32.5  RDW 13.4 13.6  LYMPHSABS 0.4*  --   MONOABS 0.4  --   EOSABS 0.1  --   BASOSABS 0.0  --     Recent Labs  Lab 09/22/20 1411 09/22/20 1431 09/22/20 1539 09/22/20 1854 09/23/20 0249  NA 141  --   --   --  138  K 3.6  --   --   --  3.3*  CL 106  --   --   --  106  CO2 25  --   --   --  25  GLUCOSE 123*  --   --   --  98  BUN 9  --   --   --  6  CREATININE 0.97  --   --   --  0.89  CALCIUM 9.2  --   --   --  8.7*  AST 22  --   --   --   --   ALT 16  --   --   --   --   ALKPHOS 60  --   --   --   --   BILITOT 0.3  --   --   --   --   ALBUMIN 3.8  --   --   --   --   PROCALCITON  --   --   --   --  6.82  LATICACIDVEN  --  2.1* 2.6* 2.0* 1.2  INR  --   --  1.0  --   --     ------------------------------------------------------------------------------------------------------------------ No results for input(s): CHOL, HDL, LDLCALC, TRIG, CHOLHDL, LDLDIRECT in the last 72 hours.  No results found for: HGBA1C ------------------------------------------------------------------------------------------------------------------ No results for input(s): TSH, T4TOTAL, T3FREE, THYROIDAB in the last 72 hours.  Invalid input(s): FREET3  Cardiac Enzymes No results for input(s): CKMB, TROPONINI, MYOGLOBIN in the last 168 hours.  Invalid input(s): CK ------------------------------------------------------------------------------------------------------------------ No results found for: BNP  Micro Results Recent Results (from the past 240 hour(s))  Resp Panel by RT-PCR (Flu A&B, Covid) Nasopharyngeal Swab     Status: Abnormal   Collection Time:  09/22/20  2:31 PM   Specimen: Nasopharyngeal Swab; Nasopharyngeal(NP) swabs in vial transport medium  Result Value Ref Range Status   SARS Coronavirus 2 by RT PCR POSITIVE (A) NEGATIVE Final    Comment: RESULT CALLED TO, READ BACK BY AND VERIFIED WITH: Weldon Inches RN AT 2542 ON 09/22/2020 BY [email or web address] (NOTE) SARS-CoV-2 target nucleic acids are DETECTED.  The SARS-CoV-2 RNA is generally detectable in upper respiratory specimens during the acute phase of infection. Positive results are indicative of the presence of the identified virus, but do not rule out bacterial infection or co-infection with other pathogens not detected by the test. Clinical correlation with patient history and other diagnostic information is necessary to determine patient infection status. The expected result is Negative.  Fact Sheet for Patients: EntrepreneurPulse.com.au  Fact Sheet for Healthcare Providers: IncredibleEmployment.be  This test is not yet approved or cleared by the [place] FDA and  has been authorized for detection and/or diagnosis of SARS-CoV-2 by FDA under an Emergency Use Authorization (EUA).  This EUA will remain in effect (meaning thi s test can be used) for the duration of  the COVID-19 declaration under Section 564(b)(1) of the Act, 21 U.S.C. section 360bbb-3(b)(1), unless the authorization is terminated or revoked sooner.     Influenza A by PCR NEGATIVE NEGATIVE Final   Influenza B by PCR NEGATIVE NEGATIVE Final    Comment: (NOTE) The Xpert Xpress SARS-CoV-2/FLU/RSV plus assay is intended as an aid in the diagnosis of influenza from Nasopharyngeal swab specimens and should not be used as a sole basis for treatment. Nasal washings and aspirates are unacceptable for Xpert Xpress SARS-CoV-2/FLU/RSV testing.  Fact Sheet for Patients: EntrepreneurPulse.com.au  Fact Sheet for Healthcare  Providers: IncredibleEmployment.be  This test is not yet approved or cleared by the [place] FDA and has been authorized for detection and/or diagnosis of SARS-CoV-2 by FDA under an Emergency Use Authorization (EUA). This EUA will remain in effect (meaning this test can be used) for the duration of the COVID-19 declaration under Section 564(b)(1) of the Act, 21 U.S.C. section 360bbb-3(b)(1), unless the authorization is terminated or revoked.  Performed at [facility], [address], [place], [place] [address]   Urine culture     Status: Abnormal   Collection Time: 09/22/20  3:39 PM   Specimen: Urine, Random  Result Value Ref Range Status   Specimen Description   Final    URINE, RANDOM Performed at [facility], [address] [place], [place] [address]    Special Requests   Final    NONE Performed at [facility], [address] [place], [place] [address]    Culture (A)  Final    <10,000 COLONIES/mL INSIGNIFICANT GROWTH Performed at [facility], [address], [place], [place] [address]    Report Status 09/23/2020 FINAL  Final  Blood Culture (routine x 2)     Status: None (Preliminary result)   Collection Time: 09/22/20  3:39 PM   Specimen: BLOOD  Result Value Ref Range Status   Specimen Description   Final    BLOOD RIGHT ANTECUBITAL Performed at [facility], [place] [address], [place], [place] [address]    Special Requests   Final    BOTTLES DRAWN AEROBIC AND ANAEROBIC Blood Culture adequate volume Performed at [facility], [address] [place], [place] [address]    Culture   Final    NO GROWTH < 24 HOURS Performed at [facility], [place] [address], [place], [place] [address]    Report Status PENDING  Incomplete  Blood Culture (routine x 2)     Status: None (Preliminary result)   Collection Time: 09/22/20  5:00 PM   Specimen: BLOOD RIGHT HAND  Result Value Ref Range Status    Specimen Description   Final    BLOOD RIGHT HAND Performed at [facility], [address] [place], [place] [address]    Special Requests   Final    BOTTLES DRAWN AEROBIC AND ANAEROBIC Blood Culture results may not be optimal due to an inadequate volume of blood received in culture bottles Performed at [facility], [address] [place], [place] [address]    Culture   Final    NO GROWTH < 24 HOURS Performed at [facility], [place] [address] [place], [place] [address]    Report Status PENDING  Incomplete    Radiology Reports CT Angio Chest/Abd/Pel for Dissection W and/or W/WO  Result Date: 09/22/2020 CLINICAL DATA:  Reports sudden onset of cp that goes all the way across chest and radiates into upper back. Describes at 10/10 with SOB and nausea EXAM: CT ANGIOGRAPHY CHEST, ABDOMEN AND PELVIS TECHNIQUE: Non-contrast CT of the chest was initially obtained. Multidetector CT imaging through the chest, abdomen and pelvis was performed using the standard protocol during bolus administration of intravenous contrast. Multiplanar reconstructed images and MIPs were obtained and reviewed to evaluate the vascular anatomy. CONTRAST:  128m OMNIPAQUE IOHEXOL 350 MG/ML SOLN COMPARISON:  CTA chest Dec 07, 2019. CT abdomen pelvis April 24, 2015. FINDINGS: CTA CHEST FINDINGS Cardiovascular: Preferential opacification of the thoracic aorta. No evidence of thoracic aortic aneurysm or dissection. No central pulmonary embolus. Normal heart size. No pericardial effusion. Mediastinum/Nodes: No enlarged mediastinal, hilar, or axillary lymph nodes. Thyroid gland, trachea, and esophagus demonstrate no significant findings. Lungs/Pleura: Lungs are clear. No pleural effusion or pneumothorax. Musculoskeletal: No chest wall abnormality. No acute or significant osseous findings. Review of the MIP images confirms the above findings. CTA ABDOMEN AND PELVIS FINDINGS VASCULAR Aorta: Normal caliber aorta  without aneurysm, dissection, vasculitis or significant stenosis. Celiac: Patent without evidence of aneurysm, dissection, vasculitis or significant stenosis. SMA: Patent without evidence of aneurysm, dissection, vasculitis or significant stenosis. Renals: Both renal arteries are patent without evidence of aneurysm, dissection, vasculitis, fibromuscular dysplasia or significant stenosis. IMA: Patent without evidence of aneurysm, dissection, vasculitis or significant stenosis. Inflow: Patent without evidence of aneurysm, dissection, vasculitis or significant stenosis. Veins: No obvious venous abnormality within the limitations of this arterial phase study. Review of the MIP images confirms the above findings. NON-VASCULAR Hepatobiliary: No suspicious hepatic lesion. Gallbladder is surgically absent. No biliary ductal dilation. Pancreas: Unremarkable.  No pancreatic ductal dilatation or surrounding inflammatory changes. Spleen: Normal in size without focal abnormality. Adrenals/Urinary Tract: Adrenal glands are unremarkable. Kidneys are normal, without renal calculi, focal lesion, or hydronephrosis. Bladder is unremarkable. Stomach/Bowel: Stomach is within normal limits. Appendix appears normal. No evidence of bowel wall thickening, distention, or inflammatory changes. Lymphatic: No pathologically enlarged abdominopelvic lymph nodes. Reproductive: Uterus and bilateral adnexa are unremarkable. Other: No ascites.  No abdominal wall hernia. Musculoskeletal: No acute osseous abnormality. Review of the MIP images confirms the above findings. IMPRESSION: 1. Negative for evidence of thoracic or abdominal aortic aneurysm or dissection. 2. No acute findings in the chest, abdomen, or pelvis. 3. Status post cholecystectomy. Electronically Signed   By: Dahlia Bailiff MD   On: 09/22/2020 15:28

## 2020-09-24 LAB — CBC
HCT: 41.8 % (ref 36.0–46.0)
Hemoglobin: 13.2 g/dL (ref 12.0–15.0)
MCH: 28.1 pg (ref 26.0–34.0)
MCHC: 31.6 g/dL (ref 30.0–36.0)
MCV: 88.9 fL (ref 80.0–100.0)
Platelets: 335 10*3/uL (ref 150–400)
RBC: 4.7 MIL/uL (ref 3.87–5.11)
RDW: 13.9 % (ref 11.5–15.5)
WBC: 6.9 10*3/uL (ref 4.0–10.5)
nRBC: 0 % (ref 0.0–0.2)

## 2020-09-24 LAB — BASIC METABOLIC PANEL
Anion gap: 7 (ref 5–15)
BUN: 6 mg/dL (ref 6–20)
CO2: 23 mmol/L (ref 22–32)
Calcium: 8.8 mg/dL — ABNORMAL LOW (ref 8.9–10.3)
Chloride: 109 mmol/L (ref 98–111)
Creatinine, Ser: 1 mg/dL (ref 0.44–1.00)
GFR, Estimated: >60 mL/min (ref >60)
Glucose, Bld: 104 mg/dL — ABNORMAL HIGH (ref 70–99)
Potassium: 4.1 mmol/L (ref 3.5–5.1)
Sodium: 139 mmol/L (ref 135–145)

## 2020-09-24 LAB — PROCALCITONIN: Procalcitonin: 4.04 ng/mL

## 2020-09-24 MED ORDER — RISAQUAD PO CAPS
2.0000 | ORAL_CAPSULE | Freq: Every day | ORAL | Status: DC
  Administered 2020-09-24 – 2020-09-25 (×2): 2 via ORAL
  Filled 2020-09-24 (×2): qty 2

## 2020-09-24 MED ORDER — PHENAZOPYRIDINE HCL 200 MG PO TABS
200.0000 mg | ORAL_TABLET | Freq: Three times a day (TID) | ORAL | Status: AC
  Administered 2020-09-24 (×3): 200 mg via ORAL
  Filled 2020-09-24 (×3): qty 1

## 2020-09-24 NOTE — Progress Notes (Signed)
PROGRESS NOTE                                                                             PROGRESS NOTE                                                                                                                                                                                                             Patient Demographics:    Amy Lutz, is a 30 y.o. female, DOB - 1991/07/15, XNT:[id number]  Outpatient Primary MD for the patient is Patient, No Pcp Per    LOS - 2  Admit date - 09/22/2020    Chief Complaint  Patient presents with  . Chest Pain       Brief Narrative   30 y.o. female with medical history significant for ADHD, depression/anxiety who  resented with complaints of chest pain, CT chest/abdomen/pelvis with no evidence of dissection or acute findings, her work-up was significant for sepsis due to UTI, she tested positive for COVID-19, but reported her initial infection was on 07/31/2020 on home test kit .    Subjective:    Digna Countess today left flank pain, she had dysuria, and an episode of hematuria overnight.    Patient was seen and examined with the presence of charge nurse Erin as female chaperone    Assessment  & Plan :    Principal Problem:   Acute pyelonephritis Active Problems:   Sepsis secondary to UTI ([place])   Pyelonephritis  Sepsis due to acute hemorrhagic cystitis/bilateral pyelonephritis. -Sepsis present on admission, leukocytosis, febrile, tachycardic with elevated lactic acid at 2.6. -This is in the setting of UTI with bilateral pyelonephritis (has bilateral CVA tenderness on admission, this has improved today, she is only having left CVA tenderness) -Follow-up blood cultures, so far with no growth to date, urine culture having<10,000 colonies. -Continue with IV cefepime  -Lactic  acid normalized after IV  Hydration -Procalcitonin trending down  Hypokalemia -Repleted  Chest pain Pt with no ischemic changes  on EKG. No signs of aortic  dissection on CTA chest. Troponin level negative x 2 in ER. Recheck third level now to make sure no change in troponin to be complete. Pt low risk of CVD.   COVID-19 positive -Initial infection 07/31/2020, no indication for isolation currently, no indication for treatment as well.  SpO2: 96 % O2 Flow Rate (L/min): 2 L/min  Recent Labs  Lab 09/22/20 1411 09/22/20 1431 09/22/20 1539 09/22/20 1854 09/23/20 0249 09/24/20 0055  WBC 16.9*  --   --   --  16.1* 6.9  PLT 329  --   --   --  281 335  PROCALCITON  --   --   --   --  6.82 4.04  AST 22  --   --   --   --   --   ALT 16  --   --   --   --   --   ALKPHOS 60  --   --   --   --   --   BILITOT 0.3  --   --   --   --   --   ALBUMIN 3.8  --   --   --   --   --   INR  --   --  1.0  --   --   --   LATICACIDVEN  --  2.1* 2.6* 2.0* 1.2  --   SARSCOV2NAA  --  POSITIVE*  --   --   --   --        ABG  No results found for: PHART, PCO2ART, PO2ART, HCO3, TCO2, ACIDBASEDEF, O2SAT        Condition - Extremely Guarded  Family Communication  :  None at bedside  Code Status :  full  Consults  :  none  Procedures  :  none   Disposition Plan  :    Status is: Inpatient  Remains inpatient appropriate because:IV treatments appropriate due to intensity of illness or inability to take PO   Dispo: The patient is from: Home              Anticipated d/c is to: Home              Patient currently is not medically stable to d/c.   Difficult to place patient No      DVT Prophylaxis  :  Lovenox   Lab Results  Component Value Date   PLT 335 09/24/2020    Diet :  Diet Order            Diet Heart Room service appropriate? Yes; Fluid consistency: Thin  Diet effective now                  Inpatient Medications  Scheduled Meds: . acidophilus  2 capsule Oral Daily  . enoxaparin (LOVENOX) injection  40 mg Subcutaneous Q24H  . phenazopyridine  200 mg Oral TID WC   Continuous Infusions: .  ceFEPime (MAXIPIME) IV 2 g (09/24/20 1350)  . lactated ringers 75 mL/hr at 09/23/20 0734   PRN Meds:.acetaminophen **OR** acetaminophen, ibuprofen, ondansetron **OR** ondansetron (ZOFRAN) IV, senna-docusate  Antibiotics  :    Anti-infectives (From admission, onward)   Start     Dose/Rate Route Frequency Ordered Stop   09/23/20 1330  ceFEPIme (MAXIPIME) 2 g in sodium chloride 0.9 % 100 mL IVPB        2 g 200 mL/hr over 30 Minutes Intravenous Every 8 hours 09/23/20 1307  09/23/20 1200  Vancomycin (VANCOCIN) 1,250 mg in sodium chloride 0.9 % 250 mL IVPB  Status:  Discontinued        1,250 mg 166.7 mL/hr over 90 Minutes Intravenous Every 24 hours 09/22/20 1534 09/22/20 2042   09/22/20 2100  cefTRIAXone (ROCEPHIN) 2 g in sodium chloride 0.9 % 100 mL IVPB  Status:  Discontinued        2 g 200 mL/hr over 30 Minutes Intravenous Every 24 hours 09/22/20 2019 09/23/20 1302   09/22/20 1600  piperacillin-tazobactam (ZOSYN) IVPB 3.375 g  Status:  Discontinued        3.375 g 12.5 mL/hr over 240 Minutes Intravenous Every 8 hours 09/22/20 1526 09/22/20 2039   09/22/20 1530  vancomycin (VANCOCIN) IVPB 1000 mg/200 mL premix        1,000 mg 200 mL/hr over 60 Minutes Intravenous  Once 09/22/20 1526 09/22/20 1653        Payton Prinsen M.D on 09/24/2020 at 3:02 PM  To page go to [email or web address]   [facility] -  Office  [phone]     Objective:   Vitals:   09/23/20 0358 09/23/20 0844 09/23/20 1242 09/23/20 2000  BP: (!) 115/59  (!) 111/49 115/65  Pulse: 73  71 78  Resp: 20  (!) 21 16  Temp: 99.2 F (37.3 C)  98.8 F (37.1 C) 98.4 F (36.9 C)  TempSrc: Oral  Oral Oral  SpO2: 92% 94% 95% 96%  Weight:      Height:        Wt Readings from Last 3 Encounters:  09/22/20 89.8 kg  02/18/18 81.6 kg  02/13/18 81.6 kg     Intake/Output Summary (Last 24 hours) at 09/24/2020 1502 Last data filed at 09/23/2020 1839 Gross per 24 hour  Intake 240 ml  Output --  Net 240 ml      Physical Exam  Awake Alert, Oriented X 3, No new F.N deficits, Normal affect Symmetrical Chest wall movement, Good air movement bilaterally, CTAB RRR,No Gallops,Rubs or new Murmurs, No Parasternal Heave +ve B.Sounds, Abd Soft, she still having left CVA tenderness, right CVA tenderness has resolved, still having suprapubic tenderness.   No Cyanosis, Clubbing or edema, No new Rash or bruise      Data Review:    CBC Recent Labs  Lab 09/22/20 1411 09/23/20 0249 09/24/20 0055  WBC 16.9* 16.1* 6.9  HGB 14.2 12.5 13.2  HCT 43.2 38.5 41.8  PLT 329 281 335  MCV 86.7 86.9 88.9  MCH 28.5 28.2 28.1  MCHC 32.9 32.5 31.6  RDW 13.4 13.6 13.9  LYMPHSABS 0.4*  --   --   MONOABS 0.4  --   --   EOSABS 0.1  --   --   BASOSABS 0.0  --   --     Recent Labs  Lab 09/22/20 1411 09/22/20 1431 09/22/20 1539 09/22/20 1854 09/23/20 0249 09/24/20 0055  NA 141  --   --   --  138 139  K 3.6  --   --   --  3.3* 4.1  CL 106  --   --   --  106 109  CO2 25  --   --   --  25 23  GLUCOSE 123*  --   --   --  98 104*  BUN 9  --   --   --  6 6  CREATININE 0.97  --   --   --  0.89 1.00  CALCIUM 9.2  --   --   --  8.7* 8.8*  AST 22  --   --   --   --   --   ALT 16  --   --   --   --   --   ALKPHOS 60  --   --   --   --   --   BILITOT 0.3  --   --   --   --   --   ALBUMIN 3.8  --   --   --   --   --   PROCALCITON  --   --   --   --  6.82 4.04  LATICACIDVEN  --  2.1* 2.6* 2.0* 1.2  --   INR  --   --  1.0  --   --   --     ------------------------------------------------------------------------------------------------------------------ No results for input(s): CHOL, HDL, LDLCALC, TRIG, CHOLHDL, LDLDIRECT in the last 72 hours.  No results found for: HGBA1C ------------------------------------------------------------------------------------------------------------------ No results for input(s): TSH, T4TOTAL, T3FREE, THYROIDAB in the last 72 hours.  Invalid input(s): FREET3  Cardiac  Enzymes No results for input(s): CKMB, TROPONINI, MYOGLOBIN in the last 168 hours.  Invalid input(s): CK ------------------------------------------------------------------------------------------------------------------ No results found for: BNP  Micro Results Recent Results (from the past 240 hour(s))  Resp Panel by RT-PCR (Flu A&B, Covid) Nasopharyngeal Swab     Status: Abnormal   Collection Time: 09/22/20  2:31 PM   Specimen: Nasopharyngeal Swab; Nasopharyngeal(NP) swabs in vial transport medium  Result Value Ref Range Status   SARS Coronavirus 2 by RT PCR POSITIVE (A) NEGATIVE Final    Comment: RESULT CALLED TO, READ BACK BY AND VERIFIED WITH: Weldon Inches RN AT 4696 ON 09/22/2020 BY [email or web address] (NOTE) SARS-CoV-2 target nucleic acids are DETECTED.  The SARS-CoV-2 RNA is generally detectable in upper respiratory specimens during the acute phase of infection. Positive results are indicative of the presence of the identified virus, but do not rule out bacterial infection or co-infection with other pathogens not detected by the test. Clinical correlation with patient history and other diagnostic information is necessary to determine patient infection status. The expected result is Negative.  Fact Sheet for Patients: EntrepreneurPulse.com.au  Fact Sheet for Healthcare Providers: IncredibleEmployment.be  This test is not yet approved or cleared by the [place] FDA and  has been authorized for detection and/or diagnosis of SARS-CoV-2 by FDA under an Emergency Use Authorization (EUA).  This EUA will remain in effect (meaning thi s test can be used) for the duration of  the COVID-19 declaration under Section 564(b)(1) of the Act, 21 U.S.C. section 360bbb-3(b)(1), unless the authorization is terminated or revoked sooner.     Influenza A by PCR NEGATIVE NEGATIVE Final   Influenza B by PCR NEGATIVE NEGATIVE Final    Comment: (NOTE) The Xpert  Xpress SARS-CoV-2/FLU/RSV plus assay is intended as an aid in the diagnosis of influenza from Nasopharyngeal swab specimens and should not be used as a sole basis for treatment. Nasal washings and aspirates are unacceptable for Xpert Xpress SARS-CoV-2/FLU/RSV testing.  Fact Sheet for Patients: EntrepreneurPulse.com.au  Fact Sheet for Healthcare Providers: IncredibleEmployment.be  This test is not yet approved or cleared by the [place] FDA and has been authorized for detection and/or diagnosis of SARS-CoV-2 by FDA under an Emergency Use Authorization (EUA). This EUA will remain in effect (meaning this test can be used) for the duration of the COVID-19 declaration under Section 564(b)(1) of the Act, 21 U.S.C. section 360bbb-3(b)(1), unless the authorization is terminated or revoked.  Performed at [facility], [address] [place], [place] [address]   Urine culture     Status: Abnormal   Collection Time: 09/22/20  3:39 PM   Specimen: Urine, Random  Result Value Ref Range Status   Specimen Description   Final    URINE, RANDOM Performed at [facility], [address] [place], [place] [address]    Special Requests   Final    NONE Performed at [facility], [address] [place], [place] [address]    Culture (A)  Final    <10,000 COLONIES/mL INSIGNIFICANT GROWTH Performed at [facility], [place] [address], [place], [place] [address]    Report Status 09/23/2020 FINAL  Final  Blood Culture (routine x 2)     Status: None (Preliminary result)   Collection Time: 09/22/20  3:39 PM   Specimen: BLOOD  Result Value Ref Range Status   Specimen Description   Final    BLOOD RIGHT ANTECUBITAL Performed at [facility], [place] [address], [place], [place] [address]    Special Requests   Final    BOTTLES DRAWN AEROBIC AND ANAEROBIC Blood Culture adequate volume Performed at [facility], [address] [place], [place] [address]    Culture   Final    NO GROWTH 2 DAYS Performed at [facility], [place] [address], [place], [place] [address]    Report Status PENDING  Incomplete  Blood Culture (routine x 2)     Status: None (Preliminary result)   Collection Time: 09/22/20  5:00 PM   Specimen: BLOOD RIGHT HAND  Result Value Ref Range Status   Specimen Description   Final    BLOOD RIGHT HAND Performed at [facility], [address] [place], [place] [address]    Special Requests   Final    BOTTLES DRAWN AEROBIC AND ANAEROBIC Blood Culture results may not be optimal due to an inadequate volume of blood received in culture bottles Performed at [facility], [address] [place], [place] [address]    Culture   Final    NO GROWTH 2 DAYS Performed at [facility], [place] [address] [place], [place] [address]    Report Status PENDING  Incomplete    Radiology Reports CT Angio Chest/Abd/Pel for Dissection W and/or W/WO  Result Date: 09/22/2020 CLINICAL DATA:  Reports sudden onset of cp that goes all the way across chest and radiates into upper back. Describes at 10/10 with SOB and nausea EXAM: CT ANGIOGRAPHY CHEST, ABDOMEN AND PELVIS TECHNIQUE: Non-contrast CT of the chest was initially obtained. Multidetector CT imaging through the chest, abdomen and pelvis was performed using the standard protocol during bolus administration of intravenous contrast. Multiplanar reconstructed images and MIPs were obtained and reviewed to evaluate the vascular anatomy. CONTRAST:  128m OMNIPAQUE IOHEXOL 350 MG/ML SOLN COMPARISON:  CTA chest Dec 07, 2019. CT abdomen pelvis April 24, 2015. FINDINGS: CTA CHEST FINDINGS Cardiovascular: Preferential opacification of the thoracic aorta. No evidence of thoracic aortic aneurysm or dissection. No central pulmonary embolus. Normal heart size. No pericardial effusion. Mediastinum/Nodes: No enlarged mediastinal, hilar, or axillary  lymph nodes. Thyroid gland, trachea, and esophagus demonstrate no significant findings. Lungs/Pleura: Lungs are clear. No pleural effusion or pneumothorax. Musculoskeletal: No chest wall abnormality. No acute or significant osseous findings. Review of the MIP images confirms the above findings. CTA ABDOMEN AND PELVIS  FINDINGS VASCULAR Aorta: Normal caliber aorta without aneurysm, dissection, vasculitis or significant stenosis. Celiac: Patent without evidence of aneurysm, dissection, vasculitis or significant stenosis. SMA: Patent without evidence of aneurysm, dissection, vasculitis or significant stenosis. Renals: Both renal arteries are patent without evidence of aneurysm, dissection, vasculitis, fibromuscular dysplasia or significant stenosis. IMA: Patent without evidence of aneurysm, dissection, vasculitis or significant stenosis. Inflow: Patent without evidence of aneurysm, dissection, vasculitis or significant stenosis. Veins: No obvious venous abnormality within the limitations of this arterial phase study. Review of the MIP images confirms the above findings. NON-VASCULAR Hepatobiliary: No suspicious hepatic lesion. Gallbladder is surgically absent. No biliary ductal dilation. Pancreas: Unremarkable. No pancreatic ductal dilatation or surrounding inflammatory changes. Spleen: Normal in size without focal abnormality. Adrenals/Urinary Tract: Adrenal glands are unremarkable. Kidneys are normal, without renal calculi, focal lesion, or hydronephrosis. Bladder is unremarkable. Stomach/Bowel: Stomach is within normal limits. Appendix appears normal. No evidence of bowel wall thickening, distention, or inflammatory changes. Lymphatic: No pathologically enlarged abdominopelvic lymph nodes. Reproductive: Uterus and bilateral adnexa are unremarkable. Other: No ascites.  No abdominal wall hernia. Musculoskeletal: No acute osseous abnormality. Review of the MIP images confirms the above findings. IMPRESSION: 1. Negative  for evidence of thoracic or abdominal aortic aneurysm or dissection. 2. No acute findings in the chest, abdomen, or pelvis. 3. Status post cholecystectomy. Electronically Signed   By: Dahlia Bailiff MD   On: 09/22/2020 15:28

## 2020-09-24 NOTE — Plan of Care (Signed)
Patient is currently resting in bed. C/o headache and lower abd/ lower back pain, given PRN pain medication. OOB ambulating to BR. VSS. On room air. Call bell within reach. MIVF.   Problem: Clinical Measurements: Goal: Ability to maintain clinical measurements within normal limits will improve Outcome: Progressing Goal: Will remain free from infection Outcome: Progressing Goal: Diagnostic test results will improve Outcome: Progressing Goal: Respiratory complications will improve Outcome: Progressing Goal: Cardiovascular complication will be avoided Outcome: Progressing   Problem: Activity: Goal: Risk for activity intolerance will decrease Outcome: Progressing   Problem: Nutrition: Goal: Adequate nutrition will be maintained Outcome: Progressing   Problem: Coping: Goal: Level of anxiety will decrease Outcome: Progressing   Problem: Elimination: Goal: Will not experience complications related to bowel motility Outcome: Progressing Goal: Will not experience complications related to urinary retention Outcome: Progressing   Problem: Pain Managment: Goal: General experience of comfort will improve Outcome: Progressing   Problem: Safety: Goal: Ability to remain free from injury will improve Outcome: Progressing   Problem: Skin Integrity: Goal: Risk for impaired skin integrity will decrease Outcome: Progressing

## 2020-09-25 LAB — PROCALCITONIN: Procalcitonin: 2.23 ng/mL

## 2020-09-25 MED ORDER — CEPHALEXIN 500 MG PO CAPS: 500.0000 mg | ORAL_CAPSULE | Freq: Four times a day (QID) | ORAL | 0 refills | Status: DC

## 2020-09-25 MED ORDER — CEPHALEXIN 500 MG PO CAPS
500.0000 mg | ORAL_CAPSULE | Freq: Four times a day (QID) | ORAL | 0 refills | Status: AC
Start: 2020-09-26 — End: 2020-09-30

## 2020-09-25 NOTE — Discharge Summary (Signed)
Amy Lutz, is a 30 y.o. female  DOB 01/20/1991  MRN [id number].  Admission date:  09/22/2020  Admitting Physician  Carlton Adam, MD  Discharge Date:  09/25/2020   Primary MD  Patient, No Pcp Per  Recommendations for primary care physician for things to follow:  -Please check CBC, CMP during next visit.  Admission Diagnosis  Sepsis secondary to UTI (HCC) [A41.9, N39.0] Pyelonephritis [N12]   Discharge Diagnosis  Sepsis secondary to UTI (HCC) [A41.9, N39.0] Pyelonephritis [N12]    Principal Problem:   Acute pyelonephritis Active Problems:   Sepsis secondary to UTI [facility])   Pyelonephritis      Past Medical History:  Diagnosis Date  . ADHD (attention deficit hyperactivity disorder)   . Anxiety   . Depression   . Miscarriage   . Pyelonephritis     Past Surgical History:  Procedure Laterality Date  . CHOLECYSTECTOMY    . KNEE ARTHROSCOPY    . OVARIAN CYST SURGERY    . WISDOM TOOTH EXTRACTION         History of present illness and  Hospital Course:     Kindly see H&P for history of present illness and admission details, please review complete Labs, Consult reports and Test reports for all details in brief  HPI  from the history and physical done on the day of admission 09/22/2020   HPI: Amy Lutz is a 30 y.o. female with medical history significant for ADHD, depression/anxiety who tented with complaint of chest pain.  She reports that she was laying in bed watching TV around 1130 this morning when she experienced chest pain that she described as 10 out of 10.  She reports it started in her mid to lower back and then she developed a chest pain that was a sharp stabbing pain and would radiate into her mid back.  She does not identify any factors that started the pain.  The chest pain is now resolved completely.  She does report having some mild shortness of breath of time the pain  occurred.  Reports that for the last week to week and a half she has had dysuria and urinary frequency.  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  She reports she had some leftover Macrobid from a previous infection and took it for the last 3 days.  She reports she has had chills off and on for the last 2 days but has not had any fever.  She does report some pain in her right flank region off and on for the last few days which she has had before when she had pyelonephritis.  There is no family history of early cardiovascular disease.  States there is a history of aortic aneurysms in her family. Patient had Covid infection in mid January 2022.  States he was sick for approximately 6 days with cough, fever, chills, shortness of breath.  The symptoms are completely resolved and she does not feel  like she did when she had Covid a few months ago.  She was retested for Covid in the emergency room for admission screening and was positive.  Is positive as is most likely secondary to previous Covid infection in January.  She has no symptoms of COVID.  Her CT angiography does not show any groundglass opacities or signs of Covid infection.  Patient for COVID-19 but she had Covid a few months ago and would now have natural immunity  ED Course: In the emergency room she is found of elevated lactic acid level of 2.1 that increased to 2.6 when retested a few hours later.  Troponin was negative x2.  EKG showed no ischemic changes.  CT angiography did not reveal PE or aortic dissection.  Hospital Course    Sepsis due to acute hemorrhagic cystitis/bilateral pyelonephritis. -Sepsis present on admission, leukocytosis, febrile, tachycardic with elevated lactic acid at 2.6.  Sepsis has resolved at time of discharge, lactic acid has normalized. -This is in the setting of UTI with bilateral pyelonephritis (has bilateral CVA tenderness on admission, this has resolved currently  ). - Blood cultures with no growth to date, urine culture having<10,000 colonies. -She was treated with IV cefepime during hospital stay, she will be discharged on Keflex to finish total of 7 days treatment -Procalcitonin trending down  Hypokalemia -Repleted  Chest pain Pt with no ischemic changes on EKG. No signs of aortic dissection on CTA chest. Troponin level negative x 2 in ER. Re. Pt low risk of CVD.  COVID-19 positive -Initial infection 07/31/2020, no indication for isolation currently, no indication for treatment as well.   Discharge Condition:  stable     Discharge Instructions  and  Discharge Medications     Discharge Instructions    Increase activity slowly   Complete by: As directed      Allergies as of 09/25/2020      Reactions   Amoxicillin Nausea And Vomiting   Morphine And Related Other (See Comments)   Increases pain   Strawberry Extract Hives      Medication List    TAKE these medications   amphetamine-dextroamphetamine 20 MG 24 hr capsule Commonly known as: ADDERALL XR Take 20 mg by mouth daily.   cephALEXin 500 MG capsule Commonly known as: KEFLEX Take 1 capsule (500 mg total) by mouth 4 (four) times daily for 4 days. Start taking on: September 26, 2020   Nexplanon 68 MG Impl implant Generic drug: etonogestrel 1 each by Subdermal route once.         Diet and Activity recommendation: See Discharge Instructions above   Consults obtained -  None   Major procedures and Radiology Reports - PLEASE review detailed and final reports for all details, in brief -    CT Angio Chest/Abd/Pel for Dissection W and/or W/WO  Result Date: 09/22/2020 CLINICAL DATA:  Reports sudden onset of cp that goes all the way across chest and radiates into upper back. Describes at 10/10 with SOB and nausea EXAM: CT ANGIOGRAPHY CHEST, ABDOMEN AND PELVIS TECHNIQUE: Non-contrast CT of the chest was initially obtained. Multidetector CT imaging through the chest,  abdomen and pelvis was performed using the standard protocol during bolus administration of intravenous contrast. Multiplanar reconstructed images and MIPs were obtained and reviewed to evaluate the vascular anatomy. CONTRAST:  OMNIPAQUE IOHEXOL 350 MG/ML SOLN COMPARISON:  CTA chest Dec 07, 2019. CT abdomen pelvis April 24, 2015. FINDINGS: CTA CHEST FINDINGS Cardiovascular: Preferential opacification of the thoracic aorta. No evidence  of thoracic aortic aneurysm or dissection. No central pulmonary embolus. Normal heart size. No pericardial effusion. Mediastinum/Nodes: No enlarged mediastinal, hilar, or axillary lymph nodes. Thyroid gland, trachea, and esophagus demonstrate no significant findings. Lungs/Pleura: Lungs are clear. No pleural effusion or pneumothorax. Musculoskeletal: No chest wall abnormality. No acute or significant osseous findings. Review of the MIP images confirms the above findings. CTA ABDOMEN AND PELVIS FINDINGS VASCULAR Aorta: Normal caliber aorta without aneurysm, dissection, vasculitis or significant stenosis. Celiac: Patent without evidence of aneurysm, dissection, vasculitis or significant stenosis. SMA: Patent without evidence of aneurysm, dissection, vasculitis or significant stenosis. Renals: Both renal arteries are patent without evidence of aneurysm, dissection, vasculitis, fibromuscular dysplasia or significant stenosis. IMA: Patent without evidence of aneurysm, dissection, vasculitis or significant stenosis. Inflow: Patent without evidence of aneurysm, dissection, vasculitis or significant stenosis. Veins: No obvious venous abnormality within the limitations of this arterial phase study. Review of the MIP images confirms the above findings. NON-VASCULAR Hepatobiliary: No suspicious hepatic lesion. Gallbladder is surgically absent. No biliary ductal dilation. Pancreas: Unremarkable. No pancreatic ductal dilatation or surrounding inflammatory changes. Spleen: Normal in size  without focal abnormality. Adrenals/Urinary Tract: Adrenal glands are unremarkable. Kidneys are normal, without renal calculi, focal lesion, or hydronephrosis. Bladder is unremarkable. Stomach/Bowel: Stomach is within normal limits. Appendix appears normal. No evidence of bowel wall thickening, distention, or inflammatory changes. Lymphatic: No pathologically enlarged abdominopelvic lymph nodes. Reproductive: Uterus and bilateral adnexa are unremarkable. Other: No ascites.  No abdominal wall hernia. Musculoskeletal: No acute osseous abnormality. Review of the MIP images confirms the above findings. IMPRESSION: 1. Negative for evidence of thoracic or abdominal aortic aneurysm or dissection. 2. No acute findings in the chest, abdomen, or pelvis. 3. Status post cholecystectomy. Electronically Signed   By: Maudry MayhewJeffrey  Waltz MD   On: 09/22/2020 15:28    Micro Results    Recent Results (from the past 240 hour(s))  Resp Panel by RT-PCR (Flu A&B, Covid) Nasopharyngeal Swab     Status: Abnormal   Collection Time: 09/22/20  2:31 PM   Specimen: Nasopharyngeal Swab; Nasopharyngeal(NP) swabs in vial transport medium  Result Value Ref Range Status   SARS Coronavirus 2 by RT PCR POSITIVE (A) NEGATIVE Final    Comment: RESULT CALLED TO, READ BACK BY AND VERIFIED WITH: Winferd HumphreyJAIME BAILEY RN AT 1549 ON 09/22/2020 BY I.[email or web address] (NOTE) SARS-CoV-2 target nucleic acids are DETECTED.  The SARS-CoV-2 RNA is generally detectable in upper respiratory specimens during the acute phase of infection. Positive results are indicative of the presence of the identified virus, but do not rule out bacterial infection or co-infection with other pathogens not detected by the test. Clinical correlation with patient history and other diagnostic information is necessary to determine patient infection status. The expected result is Negative.  Fact Sheet for Patients: BloggerCourse.comhttps://www.fda.gov/media/152166/download  Fact Sheet for Healthcare  Providers: SeriousBroker.ithttps://www.fda.gov/media/152162/download  This test is not yet approved or cleared by the [place] FDA and  has been authorized for detection and/or diagnosis of SARS-CoV-2 by FDA under an Emergency Use Authorization (EUA).  This EUA will remain in effect (meaning thi s test can be used) for the duration of  the COVID-19 declaration under Section 564(b)(1) of the Act, 21 U.S.C. section 360bbb-3(b)(1), unless the authorization is terminated or revoked sooner.     Influenza A by PCR NEGATIVE NEGATIVE Final   Influenza B by PCR NEGATIVE NEGATIVE Final    Comment: (NOTE) The Xpert Xpress SARS-CoV-2/FLU/RSV plus assay is intended as an aid in the diagnosis of  influenza from Nasopharyngeal swab specimens and should not be used as a sole basis for treatment. Nasal washings and aspirates are unacceptable for Xpert Xpress SARS-CoV-2/FLU/RSV testing.  Fact Sheet for Patients: BloggerCourse.com  Fact Sheet for Healthcare Providers: SeriousBroker.it  This test is not yet approved or cleared by the [place] FDA and has been authorized for detection and/or diagnosis of SARS-CoV-2 by FDA under an Emergency Use Authorization (EUA). This EUA will remain in effect (meaning this test can be used) for the duration of the COVID-19 declaration under Section 564(b)(1) of the Act, 21 U.S.C. section 360bbb-3(b)(1), unless the authorization is terminated or revoked.  Performed at [facility], [address], [place], [place] [address]   Urine culture     Status: Abnormal   Collection Time: 09/22/20  3:39 PM   Specimen: Urine, Random  Result Value Ref Range Status   Specimen Description   Final    URINE, RANDOM Performed at [facility], [address] [place], [place] [address]    Special Requests   Final    NONE Performed at [facility], [address] [place], [place] [address]     Culture (A)  Final    <10,000 COLONIES/mL INSIGNIFICANT GROWTH Performed at [facility], [address], [place], [place] [address]    Report Status 09/23/2020 FINAL  Final  Blood Culture (routine x 2)     Status: None (Preliminary result)   Collection Time: 09/22/20  3:39 PM   Specimen: BLOOD  Result Value Ref Range Status   Specimen Description   Final    BLOOD RIGHT ANTECUBITAL Performed at [facility], [address], [place], [place] [address]    Special Requests   Final    BOTTLES DRAWN AEROBIC AND ANAEROBIC Blood Culture adequate volume Performed at [facility], [address] [place], [place] [address]    Culture   Final    NO GROWTH 2 DAYS Performed at [facility], [address] [place], [place] [address]    Report Status PENDING  Incomplete  Blood Culture (routine x 2)     Status: None (Preliminary result)   Collection Time: 09/22/20  5:00 PM   Specimen: BLOOD RIGHT HAND  Result Value Ref Range Status   Specimen Description   Final    BLOOD RIGHT HAND Performed at [facility], [address] [place], [place] [address]    Special Requests   Final    BOTTLES DRAWN AEROBIC AND ANAEROBIC Blood Culture results may not be optimal due to an inadequate volume of blood received in culture bottles Performed at [facility], [address] [place], [place] [address]    Culture   Final    NO GROWTH 2 DAYS Performed at [facility], [address], [place], [place] [address]    Report Status PENDING  Incomplete       Today   Subjective:   Jordain Radin today has no headache,no chest or abdominal pain, no back pain, no flank pain, no hematuria, no further dysuria. Objective:   Blood pressure (!) 115/55, pulse 64, temperature 98.6 F (37 C), temperature source Oral, resp. rate 18, height 5\' 2"  (1.575 m), weight 89.8 kg, SpO2 95 %, currently breastfeeding.  No intake or output data in the 24 hours ending  09/25/20 1011  Exam Awake Alert, Oriented x 3, No  new F.N deficits, Normal affect Symmetrical Chest wall movement, Good air movement bilaterally, CTAB RRR,No Gallops,Rubs or new Murmurs, No Parasternal Heave +ve B.Sounds, Abd Soft, she does have some minimal suprapubic tenderness, no  CVA tenderness, No rebound -guarding or rigidity. No Cyanosis, Clubbing or edema, No new Rash or bruise  Data Review   CBC w Diff:  Lab Results  Component Value Date   WBC 6.9 09/24/2020   HGB 13.2 09/24/2020   HCT 41.8 09/24/2020   PLT 335 09/24/2020   LYMPHOPCT 3 09/22/2020   MONOPCT 2 09/22/2020   EOSPCT 0 09/22/2020   BASOPCT 0 09/22/2020    CMP:  Lab Results  Component Value Date   NA 139 09/24/2020   K 4.1 09/24/2020   CL 109 09/24/2020   CO2 23 09/24/2020   BUN 6 09/24/2020   CREATININE 1.00 09/24/2020   PROT 6.3 (L) 09/22/2020   ALBUMIN 3.8 09/22/2020   BILITOT 0.3 09/22/2020   ALKPHOS 60 09/22/2020   AST 22 09/22/2020   ALT 16 09/22/2020  .   Total Time in preparing paper work, data evaluation and todays exam - 35 minutes  Huey Bienenstock M.D on 09/25/2020 at 10:11 AM  [facility]   Office  [phone]

## 2020-09-25 NOTE — Discharge Instructions (Signed)
Follow with Primary MD in 7 days   Get CBC, CMP, 2 view Chest X ray checked  by Primary MD next visit.    Activity: As tolerated with Full fall precautions use walker/cane & assistance as needed   Disposition Home    Diet: Regular diet   On your next visit with your primary care physician please Get Medicines reviewed and adjusted.   Please request your Prim.MD to go over all Hospital Tests and Procedure/Radiological results at the follow up, please get all Hospital records sent to your Prim MD by signing hospital release before you go home.   If you experience worsening of your admission symptoms, develop shortness of breath, life threatening emergency, suicidal or homicidal thoughts you must seek medical attention immediately by calling 911 or calling your MD immediately  if symptoms less severe.  You Must read complete instructions/literature along with all the possible adverse reactions/side effects for all the Medicines you take and that have been prescribed to you. Take any new Medicines after you have completely understood and accpet all the possible adverse reactions/side effects.   Do not drive, operating heavy machinery, perform activities at heights, swimming or participation in water activities or provide baby sitting services if your were admitted for syncope or siezures until you have seen by Primary MD or a Neurologist and advised to do so again.  Do not drive when taking Pain medications.    Do not take more than prescribed Pain, Sleep and Anxiety Medications  Special Instructions: If you have smoked or chewed Tobacco  in the last 2 yrs please stop smoking, stop any regular Alcohol  and or any Recreational drug use.  Wear Seat belts while driving.   Please note  You were cared for by a hospitalist during your hospital stay. If you have any questions about your discharge medications or the care you received while you were in the hospital after you are discharged,  you can call the unit and asked to speak with the hospitalist on call if the hospitalist that took care of you is not available. Once you are discharged, your primary care physician will handle any further medical issues. Please note that NO REFILLS for any discharge medications will be authorized once you are discharged, as it is imperative that you return to your primary care physician (or establish a relationship with a primary care physician if you do not have one) for your aftercare needs so that they can reassess your need for medications and monitor your lab values.  

## 2020-09-25 NOTE — Plan of Care (Signed)

## 2020-09-27 LAB — CULTURE, BLOOD (ROUTINE X 2)
Culture: NO GROWTH
Culture: NO GROWTH
Special Requests: ADEQUATE

## 2021-11-09 ENCOUNTER — Emergency Department (HOSPITAL_BASED_OUTPATIENT_CLINIC_OR_DEPARTMENT_OTHER): Payer: 59

## 2021-11-09 ENCOUNTER — Encounter (HOSPITAL_BASED_OUTPATIENT_CLINIC_OR_DEPARTMENT_OTHER): Payer: Self-pay | Admitting: Emergency Medicine

## 2021-11-09 ENCOUNTER — Other Ambulatory Visit: Payer: Self-pay

## 2021-11-09 ENCOUNTER — Emergency Department (HOSPITAL_BASED_OUTPATIENT_CLINIC_OR_DEPARTMENT_OTHER)
Admission: EM | Admit: 2021-11-09 | Discharge: 2021-11-09 | Disposition: A | Discharge status: Home / Self Care | Payer: 59 | Attending: Emergency Medicine | Admitting: Emergency Medicine

## 2021-11-09 DIAGNOSIS — R110 Nausea: Secondary | ICD-10-CM | POA: Insufficient documentation

## 2021-11-09 DIAGNOSIS — R197 Diarrhea, unspecified: Secondary | ICD-10-CM | POA: Diagnosis not present

## 2021-11-09 DIAGNOSIS — N939 Abnormal uterine and vaginal bleeding, unspecified: Secondary | ICD-10-CM | POA: Diagnosis not present

## 2021-11-09 DIAGNOSIS — R1031 Right lower quadrant pain: Secondary | ICD-10-CM | POA: Insufficient documentation

## 2021-11-09 DIAGNOSIS — K5900 Constipation, unspecified: Secondary | ICD-10-CM | POA: Insufficient documentation

## 2021-11-09 LAB — COMPREHENSIVE METABOLIC PANEL
ALT: 16 U/L (ref 0–44)
AST: 17 U/L (ref 15–41)
Albumin: 3.8 g/dL (ref 3.5–5.0)
Alkaline Phosphatase: 65 U/L (ref 38–126)
Anion gap: 9 (ref 5–15)
BUN: 9 mg/dL (ref 6–20)
CO2: 25 mmol/L (ref 22–32)
Calcium: 9.1 mg/dL (ref 8.9–10.3)
Chloride: 104 mmol/L (ref 98–111)
Creatinine, Ser: 0.86 mg/dL (ref 0.44–1.00)
GFR, Estimated: >60 mL/min (ref >60)
Glucose, Bld: 99 mg/dL (ref 70–99)
Potassium: 3.4 mmol/L — ABNORMAL LOW (ref 3.5–5.1)
Sodium: 138 mmol/L (ref 135–145)
Total Bilirubin: 0.3 mg/dL (ref 0.3–1.2)
Total Protein: 7 g/dL (ref 6.5–8.1)

## 2021-11-09 LAB — CBC WITH DIFFERENTIAL/PLATELET
Abs Immature Granulocytes: 0.05 10*3/uL (ref 0.00–0.07)
Basophils Absolute: 0.1 10*3/uL (ref 0.0–0.1)
Basophils Relative: 1 %
Eosinophils Absolute: 0.1 10*3/uL (ref 0.0–0.5)
Eosinophils Relative: 1 %
HCT: 42.3 % (ref 36.0–46.0)
Hemoglobin: 14.1 g/dL (ref 12.0–15.0)
Immature Granulocytes: 1 %
Lymphocytes Relative: 35 %
Lymphs Abs: 3.3 10*3/uL (ref 0.7–4.0)
MCH: 28.7 pg (ref 26.0–34.0)
MCHC: 33.3 g/dL (ref 30.0–36.0)
MCV: 86 fL (ref 80.0–100.0)
Monocytes Absolute: 0.7 10*3/uL (ref 0.1–1.0)
Monocytes Relative: 8 %
Neutro Abs: 5.1 10*3/uL (ref 1.7–7.7)
Neutrophils Relative %: 54 %
Platelets: 328 10*3/uL (ref 150–400)
RBC: 4.92 MIL/uL (ref 3.87–5.11)
RDW: 13.3 % (ref 11.5–15.5)
WBC: 9.2 10*3/uL (ref 4.0–10.5)
nRBC: 0 % (ref 0.0–0.2)

## 2021-11-09 LAB — URINALYSIS, ROUTINE W REFLEX MICROSCOPIC
Bilirubin Urine: NEGATIVE
Glucose, UA: NEGATIVE mg/dL
Ketones, ur: NEGATIVE mg/dL
Nitrite: NEGATIVE
Protein, ur: 30 mg/dL — AB
Specific Gravity, Urine: 1.025 (ref 1.005–1.030)
pH: 6.5 (ref 5.0–8.0)

## 2021-11-09 LAB — URINALYSIS, MICROSCOPIC (REFLEX): RBC / HPF: >50 RBC/hpf (ref 0–5)

## 2021-11-09 LAB — PREGNANCY, URINE: Preg Test, Ur: NEGATIVE

## 2021-11-09 MED ORDER — SODIUM CHLORIDE 0.9 % IV BOLUS
1000.0000 mL | Freq: Once | INTRAVENOUS | Status: AC
  Administered 2021-11-09: 1000 mL via INTRAVENOUS

## 2021-11-09 MED ORDER — ONDANSETRON HCL 4 MG/2ML IJ SOLN
4.0000 mg | Freq: Once | INTRAMUSCULAR | Status: AC
  Administered 2021-11-09: 4 mg via INTRAVENOUS
  Filled 2021-11-09: qty 2

## 2021-11-09 MED ORDER — FENTANYL CITRATE PF 50 MCG/ML IJ SOSY
50.0000 ug | PREFILLED_SYRINGE | Freq: Once | INTRAMUSCULAR | Status: AC
  Administered 2021-11-09: 50 ug via INTRAVENOUS
  Filled 2021-11-09: qty 1

## 2021-11-09 NOTE — Discharge Instructions (Signed)
You were seen in the emergency department for right lower abdominal pain.  You had blood work urinalysis pregnancy test CAT scan and ultrasound that did not show an obvious explanation for your symptoms.  You did have a right ovarian cyst.  This will need follow-up with your primary care doctor or your gynecologist.  You can use Tylenol and ibuprofen for pain.  Return to the emergency department if any worsening or concerning symptoms. ?

## 2021-11-09 NOTE — ED Triage Notes (Signed)
Pt arrives pov, steady gait, endorses RLQ pain x 2 days with n//d. Denies otc meds pta, took tramadol last night with some relief ?

## 2021-11-09 NOTE — ED Notes (Signed)
ED Provider at bedside. 

## 2021-11-09 NOTE — ED Provider Notes (Signed)
?[facility] ?Provider Note ? ? ?CSN: [id number] ?Arrival date & time: 11/09/21  1024 ? ?  ? ?History ? ?Chief Complaint  ?Patient presents with  ? Abdominal Pain  ? ? ?Amy Lutz is a 31 y.o. female.  She is here with complaint of right lower quadrant pain that is been going on for 2 days.  Associated with nausea.  She initially was constipated now she is having a little bit of diarrhea.  She is also vaginally bleeding, has not had a period in 2 years after getting Nexplanon.  No fevers or chills.  Has tried nothing for it.  She rates the pain as 8 out of 10 and its been constant.  No urinary symptoms.  Prior history of cholecystectomy. ? ?The history is provided by the patient.  ?Abdominal Pain ?Pain location:  RLQ ?Pain quality: aching   ?Pain severity:  Severe ?Onset quality:  Gradual ?Duration:  2 days ?Timing:  Constant ?Progression:  Worsening ?Chronicity:  New ?Context: not sick contacts and not trauma   ?Relieved by:  Nothing ?Worsened by:  Movement ?Ineffective treatments:  None tried ?Associated symptoms: constipation, diarrhea, nausea and vaginal bleeding   ?Associated symptoms: no chest pain, no chills, no cough, no dysuria, no fever, no shortness of breath, no sore throat and no vaginal discharge   ? ?  ? ?Home Medications ?Prior to Admission medications   ?Medication Sig Start Date End Date Taking? Authorizing Provider  ?amphetamine-dextroamphetamine (ADDERALL XR) 20 MG 24 hr capsule Take 20 mg by mouth daily. 08/19/20   [provider]  ?etonogestrel (NEXPLANON) 68 MG IMPL implant 1 each by Subdermal route once. 09/30/19 09/15/22  [provider]  ?   ? ?Allergies    ?Iodinated contrast media, Amoxicillin, Morphine and related, Strawberry extract, Sulfa antibiotics, and Sulfamethoxazole-trimethoprim   ? ?Review of Systems   ?Review of Systems  ?Constitutional:  Negative for chills and fever.  ?HENT:  Negative for sore throat.   ?Eyes:  Negative for visual  disturbance.  ?Respiratory:  Negative for cough and shortness of breath.   ?Cardiovascular:  Negative for chest pain.  ?Gastrointestinal:  Positive for abdominal pain, constipation, diarrhea and nausea.  ?Genitourinary:  Positive for vaginal bleeding. Negative for dysuria and vaginal discharge.  ?Musculoskeletal:  Negative for neck pain.  ?Skin:  Negative for rash.  ?Neurological:  Negative for headaches.  ? ?Physical Exam ?Updated Vital Signs ?BP (!) 142/65   Pulse 98   Temp 98.1 ?F (36.7 ?C) (Oral)   Resp 18   Ht 5\' 3"  (1.6 m)   Wt 95.3 kg   SpO2 96%   BMI 37.20 kg/m?  ?Physical Exam ?Vitals and nursing note reviewed.  ?Constitutional:   ?   General: She is not in acute distress. ?   Appearance: Normal appearance. She is well-developed.  ?HENT:  ?   Head: Normocephalic and atraumatic.  ?Eyes:  ?   Conjunctiva/sclera: Conjunctivae normal.  ?Cardiovascular:  ?   Rate and Rhythm: Normal rate and regular rhythm.  ?   Heart sounds: No murmur heard. ?Pulmonary:  ?   Effort: Pulmonary effort is normal. No respiratory distress.  ?   Breath sounds: Normal breath sounds.  ?Abdominal:  ?   Palpations: Abdomen is soft.  ?   Tenderness: There is abdominal tenderness in the right lower quadrant. There is no guarding or rebound.  ?Musculoskeletal:     ?   General: No swelling.  ?   Cervical  back: Neck supple.  ?Skin: ?   General: Skin is warm and dry.  ?   Capillary Refill: Capillary refill takes less than 2 seconds.  ?Neurological:  ?   General: No focal deficit present.  ?   Mental Status: She is alert.  ? ? ?ED Results / Procedures / Treatments   ?Labs ?(all labs ordered are listed, but only abnormal results are displayed) ?Labs Reviewed  ?COMPREHENSIVE METABOLIC PANEL - Abnormal; Notable for the following components:  ?    Result Value  ? Potassium 3.4 (*)   ? All other components within normal limits  ?URINALYSIS, ROUTINE W REFLEX MICROSCOPIC - Abnormal; Notable for the following components:  ? Color, Urine AMBER (*)    ? APPearance CLOUDY (*)   ? Hgb urine dipstick LARGE (*)   ? Protein, ur 30 (*)   ? Leukocytes,Ua TRACE (*)   ? All other components within normal limits  ?URINALYSIS, MICROSCOPIC (REFLEX) - Abnormal; Notable for the following components:  ? Bacteria, UA MANY (*)   ? All other components within normal limits  ?CBC WITH DIFFERENTIAL/PLATELET  ?PREGNANCY, URINE  ? ? ?EKG ?None ? ?Radiology ?CT Renal Stone Study ? ?Result Date: 11/09/2021 ?CLINICAL DATA:  Right lower quadrant pain EXAM: CT ABDOMEN AND PELVIS WITHOUT CONTRAST TECHNIQUE: Multidetector CT imaging of the abdomen and pelvis was performed following the standard protocol without IV contrast. RADIATION DOSE REDUCTION: This exam was performed according to the departmental dose-optimization program which includes automated exposure control, adjustment of the mA and/or kV according to patient size and/or use of iterative reconstruction technique. COMPARISON:  None. FINDINGS: Lower chest: No acute abnormality. Hepatobiliary: No focal liver abnormality is seen. Status post cholecystectomy. No biliary dilatation. Pancreas: Unremarkable. No pancreatic ductal dilatation or surrounding inflammatory changes. Spleen: Normal in size without focal abnormality. Adrenals/Urinary Tract: Adrenal glands are unremarkable. Kidneys are normal, without renal calculi, focal lesion, or hydronephrosis. Bladder is unremarkable. Stomach/Bowel: Stomach is within normal limits. Appendix appears normal. No evidence of bowel wall thickening, distention, or inflammatory changes. Vascular/Lymphatic: No significant vascular findings are present. No enlarged abdominal or pelvic lymph nodes. Reproductive: Uterus is unremarkable. Right adnexal cyst measuring 3.3 cm. Other: No abdominal wall hernia or abnormality. No abdominopelvic ascites. Musculoskeletal: No acute or significant osseous findings. IMPRESSION: 1. No acute findings in the abdomen or pelvis, including no evidence of obstructive  uropathy. 2. Right adnexal cyst measuring 3.3 cm, likely physiologic with no further follow-up imaging recommended. Finding can be a cause of right lower quadrant pain. Electronically Signed   By: Yetta Glassman M.D.   On: 11/09/2021 12:25  ? ?US PELVIC COMPLETE W TRANSVAGINAL AND TORSION R/O ? ?Result Date: 11/09/2021 ?CLINICAL DATA:  RIGHT lower quadrant pain, RIGHT ovarian cyst on CT, has Nexplanon, question ovarian torsion, LMP 11/05/2021 EXAM: TRANSABDOMINAL AND TRANSVAGINAL ULTRASOUND OF PELVIS DOPPLER ULTRASOUND OF OVARIES TECHNIQUE: Both transabdominal and transvaginal ultrasound examinations of the pelvis were performed. Transabdominal technique was performed for global imaging of the pelvis including uterus, ovaries, adnexal regions, and pelvic cul-de-sac. It was necessary to proceed with endovaginal exam following the transabdominal exam to visualize the endometrium and ovaries. Color and duplex Doppler ultrasound was utilized to evaluate blood flow to the ovaries. COMPARISON:  Ultrasound pelvis 10/15/2020, CT abdomen and pelvis 11/09/2021 FINDINGS: Uterus Measurements: 7.8 x 2.9 x 5.0 cm = volume: 59 mL. Anteverted. Normal morphology without mass Endometrium Thickness: 7 mm.  No endometrial fluid or mass Right ovary Measurements: 1.1 x 2.7 x 3.3  cm = volume: 19.1 mL. 3.5 cm right ovarian benign functional cyst. No follow-up imaging is recommended. Reference: Radiology 2019 Nov;293(2):359-371 blood flow present within RIGHT ovary on color Doppler imaging. Left ovary Measurements: 1.7 x 1.3 x 1.2 cm = volume: 1.4 mL. Normal appearance/no adnexal mass. Pulsed Doppler evaluation of both ovaries demonstrates normal low-resistance arterial and venous waveforms. Other findings No free pelvic fluid.  No adnexal masses. IMPRESSION: 3.5 cm diameter simple cyst RIGHT ovary; no follow-up imaging recommended, as above. Otherwise normal exam. No sonographic evidence of RIGHT ovarian torsion. Electronically Signed    By: Lavonia Dana M.D.   On: 11/09/2021 14:05   ? ?Procedures ?Procedures  ? ? ?Medications Ordered in ED ?Medications  ?sodium chloride 0.9 % bolus 1,000 mL (has no administration in time range)  ?ondansetron (ZO

## 2023-03-21 ENCOUNTER — Encounter (HOSPITAL_BASED_OUTPATIENT_CLINIC_OR_DEPARTMENT_OTHER): Payer: Self-pay | Admitting: Pediatrics

## 2023-03-21 ENCOUNTER — Emergency Department (HOSPITAL_BASED_OUTPATIENT_CLINIC_OR_DEPARTMENT_OTHER): Payer: 59

## 2023-03-21 ENCOUNTER — Other Ambulatory Visit (HOSPITAL_BASED_OUTPATIENT_CLINIC_OR_DEPARTMENT_OTHER): Payer: Self-pay

## 2023-03-21 ENCOUNTER — Other Ambulatory Visit: Payer: Self-pay

## 2023-03-21 ENCOUNTER — Emergency Department (HOSPITAL_BASED_OUTPATIENT_CLINIC_OR_DEPARTMENT_OTHER)
Admission: EM | Admit: 2023-03-21 | Discharge: 2023-03-21 | Disposition: A | Discharge status: Home / Self Care | Payer: 59 | Attending: Emergency Medicine | Admitting: Emergency Medicine

## 2023-03-21 DIAGNOSIS — S060XAA Concussion with loss of consciousness status unknown, initial encounter: Secondary | ICD-10-CM | POA: Diagnosis not present

## 2023-03-21 DIAGNOSIS — W541XXA Struck by dog, initial encounter: Secondary | ICD-10-CM | POA: Diagnosis not present

## 2023-03-21 DIAGNOSIS — S0990XA Unspecified injury of head, initial encounter: Secondary | ICD-10-CM | POA: Diagnosis present

## 2023-03-21 DIAGNOSIS — J3489 Other specified disorders of nose and nasal sinuses: Secondary | ICD-10-CM | POA: Insufficient documentation

## 2023-03-21 MED ORDER — ONDANSETRON 4 MG PO TBDP
4.0000 mg | ORAL_TABLET | Freq: Once | ORAL | Status: AC
  Administered 2023-03-21: 4 mg via ORAL
  Filled 2023-03-21: qty 1

## 2023-03-21 MED ORDER — ONDANSETRON 4 MG PO TBDP
4.0000 mg | ORAL_TABLET | Freq: Three times a day (TID) | ORAL | 0 refills | Status: AC | PRN
Start: 2023-03-21 — End: ?
  Filled 2023-03-21: qty 18, 6d supply, fill #0

## 2023-03-21 MED ORDER — ACETAMINOPHEN 325 MG PO TABS
650.0000 mg | ORAL_TABLET | Freq: Once | ORAL | Status: AC
  Administered 2023-03-21: 650 mg via ORAL
  Filled 2023-03-21: qty 2

## 2023-03-21 NOTE — ED Triage Notes (Signed)
Reported injured head against a 120 lb dog they're trying to sedate; +LOC; c/o concussion like symptoms of nausea, "brain fog" and dizziness. C/O headache and took ibuprofen PTA.

## 2023-03-21 NOTE — Discharge Instructions (Signed)
Follow-up with sports medicine doctor.  As we discussed I recommend you use Tylenol and ibuprofen but mainly rest and avoiding screens such as your phone and TVs.  Lying in a dark room that is quiet will also be pretty helpful for any bad headaches that you might have over the next few days.  I recommend taking Zofran as needed for nausea symptoms.  Drink plenty of fluids.  Please return if symptoms worsen.

## 2023-03-21 NOTE — ED Provider Notes (Signed)
[facility] Provider Note   CSN: [id number] Arrival date & time: 03/21/23  1337     History  Chief Complaint  Patient presents with   Head Injury    Amy Lutz is a 32 y.o. female.  Patient here after she was hit in the head by a dog that they were trying to sedate at work.  She is having some nausea and some dizziness.  History of concussions in the past.  She states at least 4.  She has a history of POTS, anxiety, depression.  Does not know if she lost consciousness.  She just has not felt very well.  Denies any weakness numbness tingling.  No neck pain.  No other extremity pain.  The history is provided by the patient.       Home Medications Prior to Admission medications   Medication Sig Start Date End Date Taking? Authorizing Provider  ondansetron (ZOFRAN-ODT) 4 MG disintegrating tablet Take 1 tablet (4 mg total) by mouth every 8 (eight) hours as needed. 03/21/23  Yes Laura-Lee Villegas, DO  amphetamine-dextroamphetamine (ADDERALL XR) 20 MG 24 hr capsule Take 20 mg by mouth daily. 08/19/20   [provider]  etonogestrel (NEXPLANON) 68 MG IMPL implant 1 each by Subdermal route once. 09/30/19 09/15/22  [provider]      Allergies    Iodinated contrast media, Amoxicillin, Morphine and codeine, Strawberry extract, Sulfa antibiotics, and Sulfamethoxazole-trimethoprim    Review of Systems   Review of Systems  Physical Exam Updated Vital Signs BP 126/71 (BP Location: Left Arm)   Pulse 91   Temp 97.8 F (36.6 C) (Oral)   Resp 17   Ht 5\' 3"  (1.6 m)   Wt 86.2 kg   SpO2 100%   BMI 33.66 kg/m  Physical Exam Vitals and nursing note reviewed.  Constitutional:      General: She is not in acute distress.    Appearance: She is well-developed. She is not ill-appearing.  HENT:     Head: Normocephalic and atraumatic.     Nose: Nose normal.     Mouth/Throat:     Mouth: Mucous membranes are moist.  Eyes:      Extraocular Movements: Extraocular movements intact.     Conjunctiva/sclera: Conjunctivae normal.     Pupils: Pupils are equal, round, and reactive to light.  Cardiovascular:     Rate and Rhythm: Normal rate and regular rhythm.     Pulses: Normal pulses.     Heart sounds: Normal heart sounds. No murmur heard. Pulmonary:     Effort: Pulmonary effort is normal. No respiratory distress.     Breath sounds: Rhonchi present.  Abdominal:     Palpations: Abdomen is soft.     Tenderness: There is no abdominal tenderness.  Musculoskeletal:        General: No swelling.     Cervical back: Normal range of motion and neck supple.  Skin:    General: Skin is warm and dry.     Capillary Refill: Capillary refill takes less than 2 seconds.  Neurological:     General: No focal deficit present.     Mental Status: She is alert and oriented to person, place, and time.     Cranial Nerves: No cranial nerve deficit.     Sensory: No sensory deficit.     Motor: No weakness.     Coordination: Coordination normal.     Comments: 5+ out of 5 strength throughout,  normal sensation, normal speech  Psychiatric:        Mood and Affect: Mood normal.     ED Results / Procedures / Treatments   Labs (all labs ordered are listed, but only abnormal results are displayed) Labs Reviewed - No data to display  EKG None  Radiology CT Head Wo Contrast  Result Date: 03/21/2023 CLINICAL DATA:  Headache, intracranial hypertension features EXAM: CT HEAD WITHOUT CONTRAST TECHNIQUE: Contiguous axial images were obtained from the base of the skull through the vertex without intravenous contrast. RADIATION DOSE REDUCTION: This exam was performed according to the departmental dose-optimization program which includes automated exposure control, adjustment of the mA and/or kV according to patient size and/or use of iterative reconstruction technique. COMPARISON:  None Available. FINDINGS: Brain: No evidence of acute infarction,  hemorrhage, hydrocephalus, extra-axial collection or mass lesion/mass effect. Vascular: No hyperdense vessel or unexpected calcification. Skull: Normal. Negative for fracture or focal lesion. Sinuses/Orbits: No middle ear or mastoid effusion. Paranasal sinuses are clear. Orbits are unremarkable. Other: None. IMPRESSION: No acute intracranial abnormality. Electronically Signed   By: Lorenza Cambridge M.D.   On: 03/21/2023 16:12    Procedures Procedures    Medications Ordered in ED Medications  ondansetron (ZOFRAN-ODT) disintegrating tablet 4 mg (4 mg Oral Given 03/21/23 1519)  acetaminophen (TYLENOL) tablet 650 mg (650 mg Oral Given 03/21/23 1519)    ED Course/ Medical Decision Making/ A&P                                 Medical Decision Making Risk OTC drugs. Prescription drug management.   Amy Lutz is here with headache after head injury.  She was hit in the head by a fairly heavy dog at work that they were trying to sedate.  She has history of concussions.  Stomach concussion-like symptoms with nausea and dizziness.  She took ibuprofen prior to arrival.  Differential diagnosis likely concussion, seems unlikely to be a head bleed but will get a head CT.  She is not having any pain elsewhere.  History of depression and anxiety otherwise.  Neurologically she is intact.  She is having concussive type symptoms.  Will give her Zofran and Tylenol and get a head CT.  Anticipate if CT scans unremarkable to have her follow-up with sports medicine to give her time off of work.  I have educated her about concussions and treatment including rest and trying to avoid phones and TVs.  Can use Zofran as needed as well as Tylenol and ibuprofen.  CT scan unremarkable per radiology report.  Overall I do suspect likely concussion.  Will refer her to sports medicine.  Recommend Tylenol and ibuprofen and Zofran.  Discharged in good condition.  Understands return precautions.  This chart was dictated using voice  recognition software.  Despite best efforts to proofread,  errors can occur which can change the documentation meaning.         Final Clinical Impression(s) / ED Diagnoses Final diagnoses:  Concussion with unknown loss of consciousness status, initial encounter    Rx / DC Orders ED Discharge Orders          Ordered    ondansetron (ZOFRAN-ODT) 4 MG disintegrating tablet  Every 8 hours PRN        03/21/23 1520              Marienthal, Madelaine Bhat, DO 03/21/23 1615

## 2023-07-23 ENCOUNTER — Emergency Department (HOSPITAL_BASED_OUTPATIENT_CLINIC_OR_DEPARTMENT_OTHER)
Admission: EM | Admit: 2023-07-23 | Discharge: 2023-07-23 | Discharge status: Against Medical Advice | Payer: 59 | Attending: Emergency Medicine | Admitting: Emergency Medicine

## 2023-07-23 ENCOUNTER — Emergency Department (HOSPITAL_BASED_OUTPATIENT_CLINIC_OR_DEPARTMENT_OTHER): Payer: 59

## 2023-07-23 ENCOUNTER — Encounter (HOSPITAL_BASED_OUTPATIENT_CLINIC_OR_DEPARTMENT_OTHER): Payer: Self-pay | Admitting: Emergency Medicine

## 2023-07-23 ENCOUNTER — Other Ambulatory Visit: Payer: Self-pay

## 2023-07-23 DIAGNOSIS — R519 Headache, unspecified: Secondary | ICD-10-CM | POA: Diagnosis present

## 2023-07-23 DIAGNOSIS — R0789 Other chest pain: Secondary | ICD-10-CM

## 2023-07-23 DIAGNOSIS — D72829 Elevated white blood cell count, unspecified: Secondary | ICD-10-CM | POA: Diagnosis not present

## 2023-07-23 DIAGNOSIS — R079 Chest pain, unspecified: Secondary | ICD-10-CM | POA: Insufficient documentation

## 2023-07-23 DIAGNOSIS — Z5329 Procedure and treatment not carried out because of patient's decision for other reasons: Secondary | ICD-10-CM | POA: Diagnosis not present

## 2023-07-23 LAB — PREGNANCY, URINE: Preg Test, Ur: NEGATIVE

## 2023-07-23 LAB — CBC
HCT: 43.1 % (ref 36.0–46.0)
Hemoglobin: 14.3 g/dL (ref 12.0–15.0)
MCH: 28.4 pg (ref 26.0–34.0)
MCHC: 33.2 g/dL (ref 30.0–36.0)
MCV: 85.7 fL (ref 80.0–100.0)
Platelets: 337 10*3/uL (ref 150–400)
RBC: 5.03 MIL/uL (ref 3.87–5.11)
RDW: 13.3 % (ref 11.5–15.5)
WBC: 11.1 10*3/uL — ABNORMAL HIGH (ref 4.0–10.5)
nRBC: 0 % (ref 0.0–0.2)

## 2023-07-23 LAB — BASIC METABOLIC PANEL
Anion gap: 9 (ref 5–15)
BUN: 10 mg/dL (ref 6–20)
CO2: 23 mmol/L (ref 22–32)
Calcium: 9.4 mg/dL (ref 8.9–10.3)
Chloride: 105 mmol/L (ref 98–111)
Creatinine, Ser: 0.7 mg/dL (ref 0.44–1.00)
GFR, Estimated: >60 mL/min (ref >60)
Glucose, Bld: 98 mg/dL (ref 70–99)
Potassium: 3.6 mmol/L (ref 3.5–5.1)
Sodium: 137 mmol/L (ref 135–145)

## 2023-07-23 LAB — TROPONIN I (HIGH SENSITIVITY): Troponin I (High Sensitivity): <2 ng/L (ref <18)

## 2023-07-23 MED ORDER — DIPHENHYDRAMINE HCL 50 MG/ML IJ SOLN
12.5000 mg | Freq: Once | INTRAMUSCULAR | Status: AC
  Administered 2023-07-23: 12.5 mg via INTRAVENOUS
  Filled 2023-07-23: qty 1

## 2023-07-23 MED ORDER — ACETAMINOPHEN 500 MG PO TABS
1000.0000 mg | ORAL_TABLET | Freq: Once | ORAL | Status: AC
  Administered 2023-07-23: 1000 mg via ORAL
  Filled 2023-07-23: qty 2

## 2023-07-23 MED ORDER — KETOROLAC TROMETHAMINE 15 MG/ML IJ SOLN
15.0000 mg | Freq: Once | INTRAMUSCULAR | Status: AC
  Administered 2023-07-23: 15 mg via INTRAVENOUS
  Filled 2023-07-23: qty 1

## 2023-07-23 MED ORDER — PROCHLORPERAZINE EDISYLATE 10 MG/2ML IJ SOLN
10.0000 mg | Freq: Once | INTRAMUSCULAR | Status: AC
  Administered 2023-07-23: 10 mg via INTRAVENOUS
  Filled 2023-07-23: qty 2

## 2023-07-23 MED ORDER — MAGNESIUM SULFATE 2 GM/50ML IV SOLN
2.0000 g | Freq: Once | INTRAVENOUS | Status: AC
  Administered 2023-07-23: 2 g via INTRAVENOUS
  Filled 2023-07-23: qty 50

## 2023-07-23 NOTE — ED Triage Notes (Signed)
 Reports palpitation and high BP at PCP . Chest pain , sharp and stabbing to mid chest she said  Headache and fatigue

## 2023-07-23 NOTE — ED Provider Notes (Signed)
  [facility] AT [facility] Provider Note   CSN: [id number] Arrival date & time: 07/23/23  1622     History  Chief Complaint  Patient presents with   Chest Pain    Amy Lutz is a 33 y.o. female with PMH as listed below who presents with 1 week of posterior headache that radiates around the front of her head to her temples and above her eyes.  Currently rated 8.5 out of 10.  Has tried Tylenol  Motrin  Zofran  and Benadryl  altogether which have dulled the headache but not taken away.  It is constant and not throbbing.  Today she began having central stabbing chest pain that is constant, nonradiating.  Not associate with any vomiting, but has had nausea-reports history of Nissen fundoplication a year ago so she cannot vomit.  Denies any asymmetric leg swelling, history of DVT or PE, recent surgeries hospitalizations or immobilizations or travel.  She had her Nexplanon removed this morning because she thought that might be contributing to her symptoms.  Went to see her PCP this morning and noted her blood pressure to be greater than 200 systolic and so sent her to the ED for further evaluation.  Also endorses fatigue.  Denies fever/chills, sore throat, cough, abdominal pain, urinary or vaginal symptoms, diarrhea constipation.  Past Medical History:  Diagnosis Date   ADHD (attention deficit hyperactivity disorder)    Anxiety    Depression    Miscarriage    Pyelonephritis        Home Medications Prior to Admission medications   Medication Sig Start Date End Date Taking? Authorizing Provider  amphetamine-dextroamphetamine (ADDERALL XR) 20 MG 24 hr capsule Take 20 mg by mouth daily. 08/19/20   [provider]  etonogestrel (NEXPLANON) 68 MG IMPL implant 1 each by Subdermal route once. 09/30/19 09/15/22  [provider]  ondansetron  (ZOFRAN -ODT) 4 MG disintegrating tablet Take 1 tablet (4 mg total) by mouth every 8 (eight) hours as needed. 03/21/23    Curatolo, Adam, DO      Allergies    Iodinated contrast media, Amoxicillin , Morphine and codeine, Strawberry extract, Sulfa antibiotics, and Sulfamethoxazole-trimethoprim    Review of Systems   Review of Systems A 10 point review of systems was performed and is negative unless otherwise reported in HPI.  Physical Exam Updated Vital Signs BP 120/66   Pulse 94   Temp 99.2 F (37.3 C)   Resp 14   Wt 91.6 kg   SpO2 98%   BMI 35.78 kg/m  Physical Exam General: Normal appearing female, lying in bed.  HEENT: PERRLA, Sclera anicteric, MMM, trachea midline.  Cardiology: RRR, no murmurs/rubs/gallops. No chest wall TTP.  Resp: Normal respiratory rate and effort. CTAB, no wheezes, rhonchi, crackles.  Abd: Soft, non-tender, non-distended. No rebound tenderness or guarding.  GU: Deferred. MSK: No peripheral edema or signs of trauma.  Skin: warm, dry.  Back: No CVA tenderness Neuro: A&Ox4, CNs II-XII grossly intact. MAEs. Sensation grossly intact.  Psych: Normal mood and affect.   ED Results / Procedures / Treatments   Labs (all labs ordered are listed, but only abnormal results are displayed) Labs Reviewed  CBC - Abnormal; Notable for the following components:      Result Value   WBC 11.1 (*)    All other components within normal limits  RESP PANEL BY RT-PCR (RSV, FLU A&B, COVID)  [id number]  BASIC METABOLIC PANEL  PREGNANCY, URINE  TROPONIN I (HIGH SENSITIVITY)  TROPONIN I (HIGH SENSITIVITY)  EKG EKG Interpretation Date/Time:  Monday July 23 2023 18:17:28 EST Ventricular Rate:  82 PR Interval:  167 QRS Duration:  108 QT Interval:  391 QTC Calculation: 457 R Axis:   47  Text Interpretation: Sinus rhythm Low voltage, precordial leads No significant change since last tracing Confirmed by Nicholaus Dolphin [phone]) on 07/24/2023 6:42:32 PM  Radiology DG Chest 2 View Result Date: 07/23/2023 CLINICAL DATA:  Chest pain. Palpitations. High blood pressure. Sharp stabbing pain to  the mid chest. Headache and fatigue. EXAM: CHEST - 2 VIEW COMPARISON:  06/06/2023 FINDINGS: The heart size and mediastinal contours are within normal limits. Both lungs are clear. The visualized skeletal structures are unremarkable. IMPRESSION: No active cardiopulmonary disease. Electronically Signed   By: Elsie Gravely M.D.   On: 07/23/2023 18:05    Procedures Procedures    Medications Ordered in ED Medications  acetaminophen  (TYLENOL ) tablet 1,000 mg (1,000 mg Oral Given 07/23/23 1759)  ketorolac  (TORADOL ) 15 MG/ML injection 15 mg (15 mg Intravenous Given 07/23/23 1800)  prochlorperazine  (COMPAZINE ) injection 10 mg (10 mg Intravenous Given 07/23/23 1804)  diphenhydrAMINE  (BENADRYL ) injection 12.5 mg (12.5 mg Intravenous Given 07/23/23 1802)  magnesium  sulfate IVPB 2 g 50 mL (0 g Intravenous Stopped 07/23/23 1836)    ED Course/ Medical Decision Making/ A&P                          Medical Decision Making Amount and/or Complexity of Data Reviewed Labs: ordered. Decision-making details documented in ED Course. Radiology: ordered.  Risk OTC drugs. Prescription drug management.    This patient presents to the ED for concern of HA/CP, this involves an extensive number of treatment options, and is a complaint that carries with it a high risk of complications and morbidity.  I considered the following differential and admission for this acute, potentially life threatening condition.   MDM:    Patient is overall very well-appearing, HDS, non-toxic, afebrile.   Patient was reported to have had high BP at PCP this morning but on arrival to ED her BP is 138/88 and she still has HA. No FNDs, unlikely to represent hypertensive emergency.   Unlikely SAH: headache is non thunderclap Unlikely Subdural/epidural hematoma: no history of trauma, no anticoagulation. Unlikely Meningitis: afebrile, no meningismus. Unlikely Temporal arteritis: pt < 51 years old.  no tenderness in temporal area Unlikely  Acute angle glaucoma: PERRLA, no eye pain. Unlikely Carbon Monoxide Poisoning: no other house members with similar symptoms.  Most likely 2/2 tension headache given report of location/type of HA. No focal neurological symptoms. Neuro exam is benign.   Will give headache cocktail and reexamine.   DDX for chest pain includes but is not limited to:  Very low suspicion for ACS vs aortic dissection given presenting sx. Patient does PERC out with no tachycardia or h/o DVT, and nexplanon is not estrogen-based birth control.  No abdominal pain and no c/f biliary disease. Consider GERD/reflux esophagitis/gastritis, or MSK pain as well. Troponin is initially negative, very reassuring. Also consider viral syndrome with headache/body aches.    Clinical Course as of 07/25/23 1538  Mon Jul 23, 2023  1746 Preg Test, Ur: NEGATIVE [HN]  1746 Troponin I (High Sensitivity): <2 neg [HN]  1746 Basic metabolic panel wnl [HN]  1746 WBC(!): 11.1 Mild leukocytosis [HN]    Clinical Course User Index [HN] Franklyn Sid SAILOR, MD    Labs: Labs ordered from triage.  Imaging Studies ordered: CXR ordered from triage  I independently visualized and interpreted imaging. I agree with the radiologist interpretation  Additional history obtained from chart review.    Cardiac Monitoring: The patient was maintained on a cardiac monitor.  I personally viewed and interpreted the cardiac monitored which showed an underlying rhythm of: NSR  Reevaluation: After the interventions noted above, I reevaluated the patient and found that they have :improved  Social Determinants of Health: Lives independently  Disposition:  On attempted reexamination, patient was found to have eloped from the ED prior to treatment being complete.     Co morbidities that complicate the patient evaluation  Past Medical History:  Diagnosis Date   ADHD (attention deficit hyperactivity disorder)    Anxiety    Depression    Miscarriage     Pyelonephritis      Medicines Meds ordered this encounter  Medications   acetaminophen  (TYLENOL ) tablet 1,000 mg   ketorolac  (TORADOL ) 15 MG/ML injection 15 mg   prochlorperazine  (COMPAZINE ) injection 10 mg   diphenhydrAMINE  (BENADRYL ) injection 12.5 mg   magnesium  sulfate IVPB 2 g 50 mL    I have reviewed the patients home medicines and have made adjustments as needed  Problem List / ED Course: Problem List Items Addressed This Visit   None Visit Diagnoses       Atypical chest pain    -  Primary     Bad headache       Relevant Medications   acetaminophen  (TYLENOL ) tablet 1,000 mg (Completed)   ketorolac  (TORADOL ) 15 MG/ML injection 15 mg (Completed)                   This note was created using dictation software, which may contain spelling or grammatical errors.    Franklyn Sid SAILOR, MD 07/25/23 [phone]

## 2023-07-23 NOTE — ED Notes (Signed)
 NT entered pt room to complete resp swab. Pt was in the middle of stopping IV medication, and requesting to have IV taken out. NT called primary nurse to ED room. Primary nurse entered ED room, pt states she feels anxious and would like to leave. Primary nurse reiterated a side effect of compazine  is possible anxiety or restlessness. Pt stated she understood but wants to leave. Primary nurse requested that pt wait to see EDP and called charge nurse to bedside, pt stated she wanted to leave at that moment and would not wait. Pt informed it is against medical advice to leave without seeing EDP and any condition could possibly worsen. Pt expressed understanding. Pt informed to come back to ED if symptoms continue or worsen.

## 2024-04-09 IMAGING — US US PELVIS COMPLETE TRANSABD/TRANSVAG W DUPLEX AND/OR DOPPLER
2 series · 13 of 25 positions shown · non-contrast
Comparison: Ultrasound pelvis 10/15/2020, CT abdomen and pelvis
11/09/2021

CLINICAL DATA: RIGHT lower quadrant pain, RIGHT ovarian cyst on CT,
has Nexplanon, question ovarian torsion, LMP 11/05/2021

EXAM:
TRANSABDOMINAL AND TRANSVAGINAL ULTRASOUND OF PELVIS
DOPPLER ULTRASOUND OF OVARIES
TECHNIQUE: Both transabdominal and transvaginal ultrasound examinations of the
pelvis were performed. Transabdominal technique was performed for
global imaging of the pelvis including uterus, ovaries, adnexal
regions, and pelvic cul-de-sac.
It was necessary to proceed with endovaginal exam following the
transabdominal exam to visualize the endometrium and ovaries. Color
and duplex Doppler ultrasound was utilized to evaluate blood flow to
the ovaries.

[Series 1: us pelvis complete transabd/transvag w duplex and/ · 12 of 96 slices shown (1 of 2)]
[im 1/96]
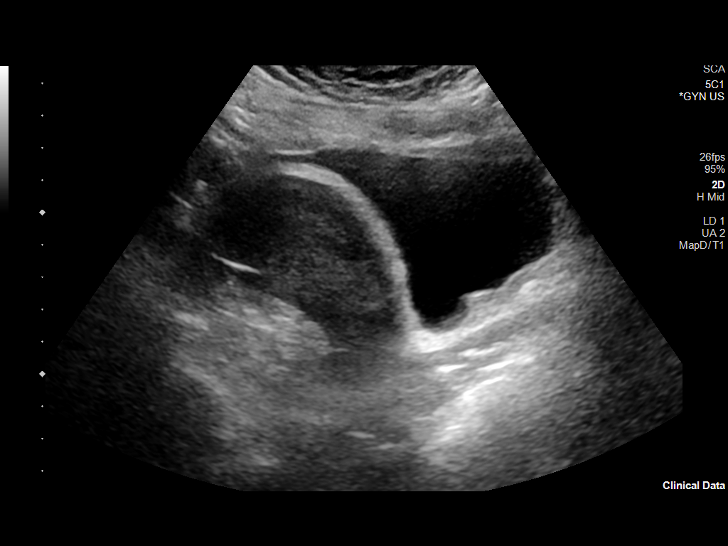
[im 9/96]
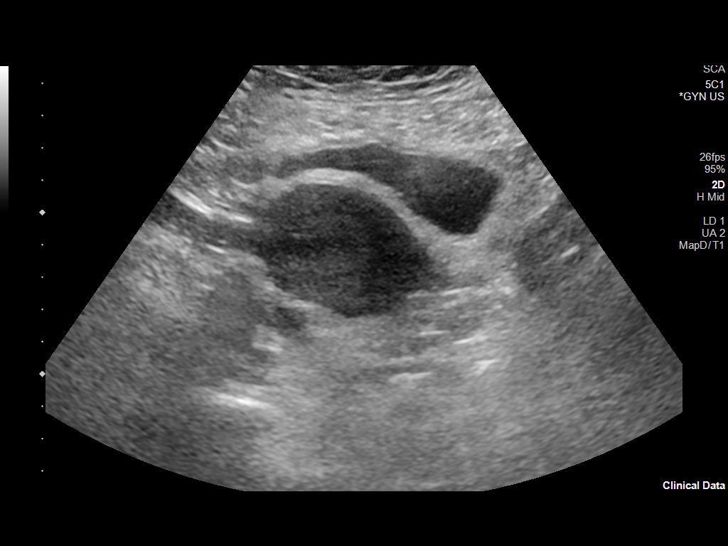
[im 17/96]
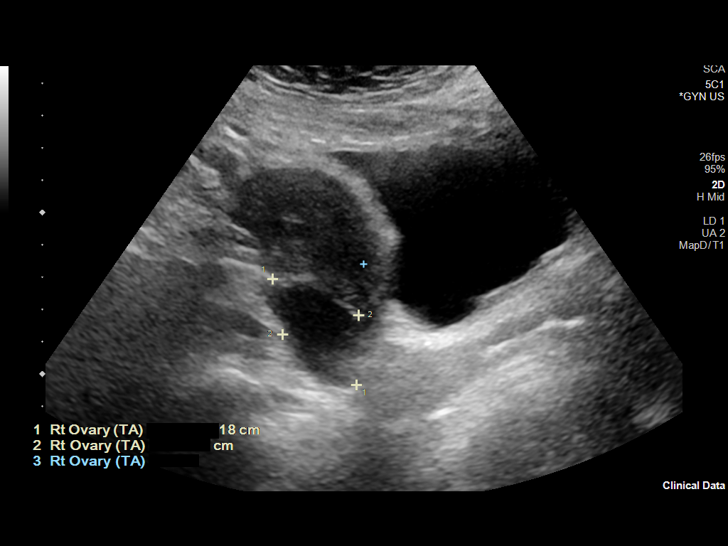
[im 25/96]
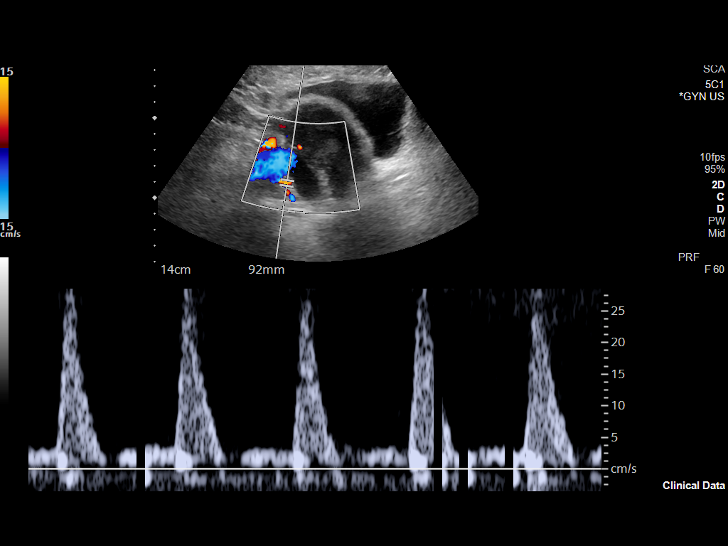
[im 34/96]
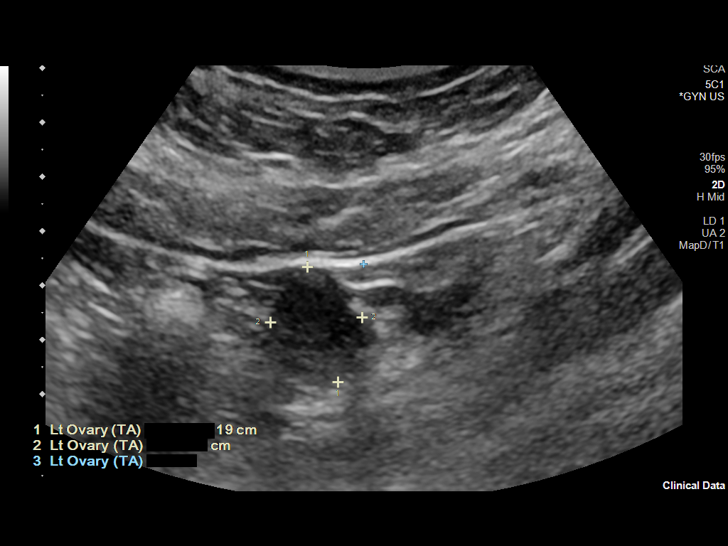
[im 42/96]
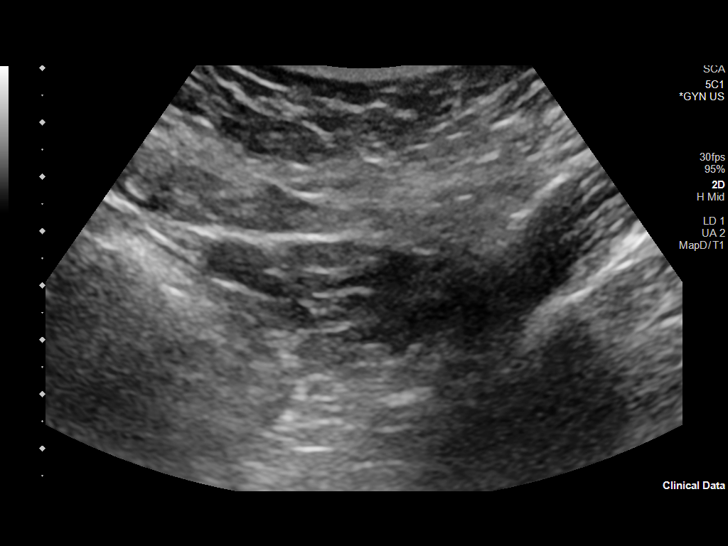
[im 50/96]
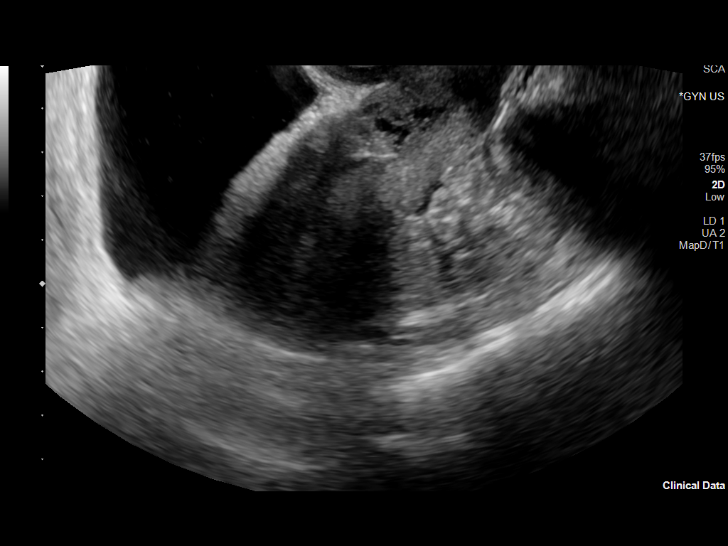
[im 58/96]
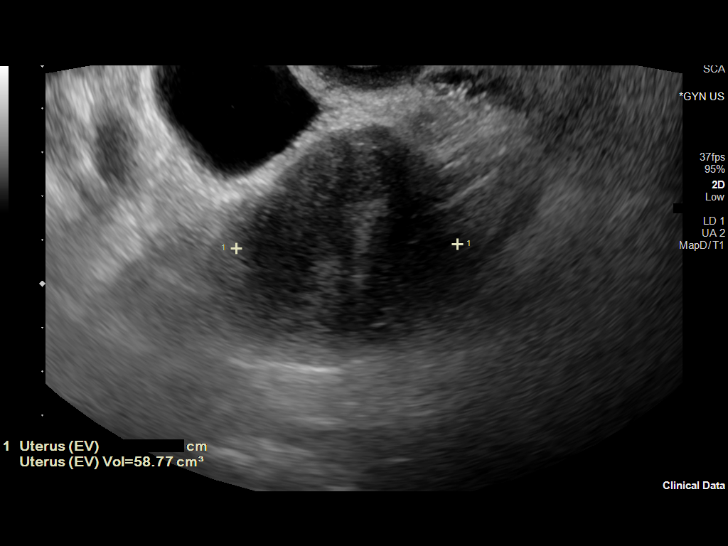
[im 67/96]
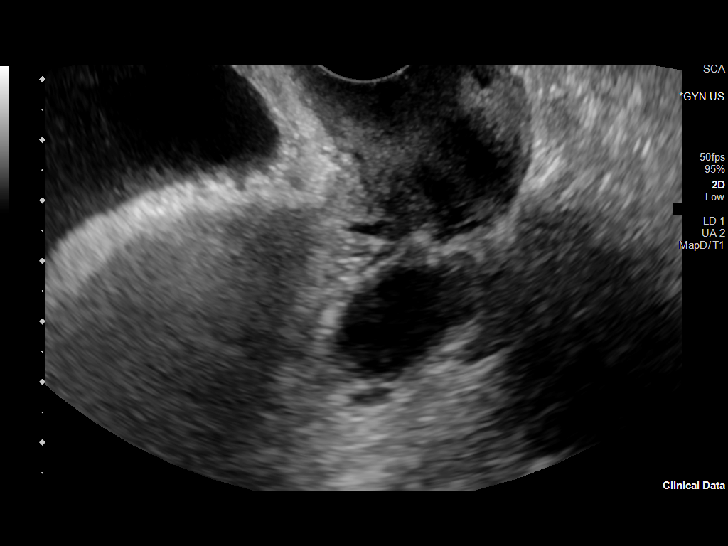
[im 75/96]
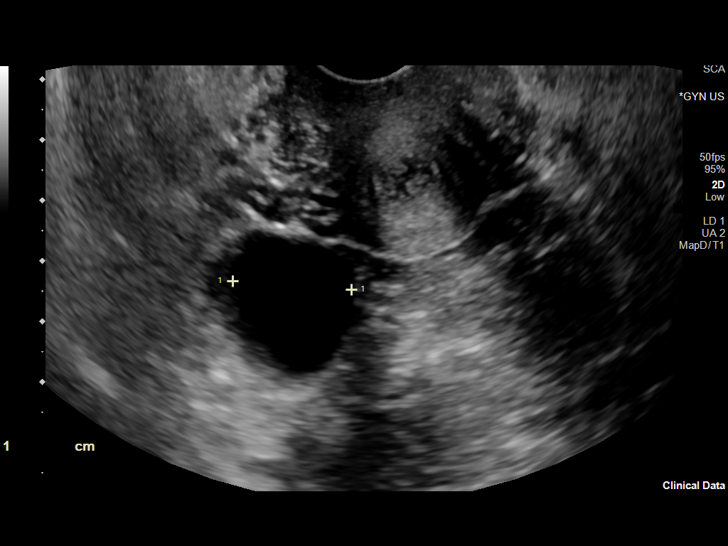
[im 83/96]
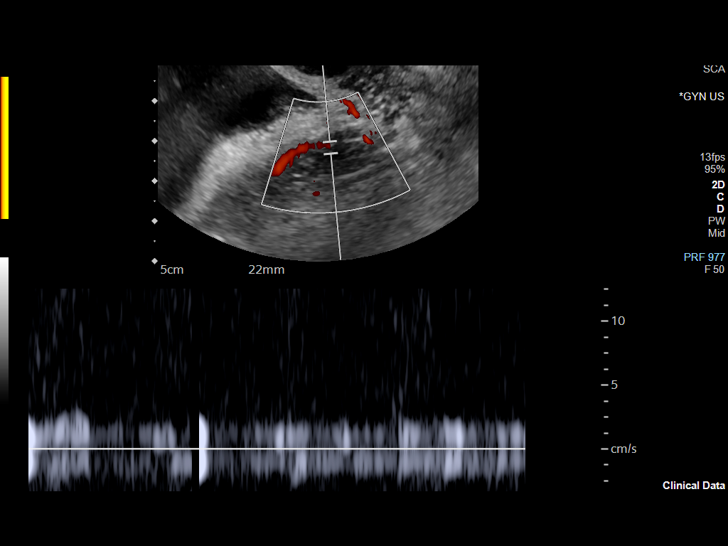
[im 91/96]
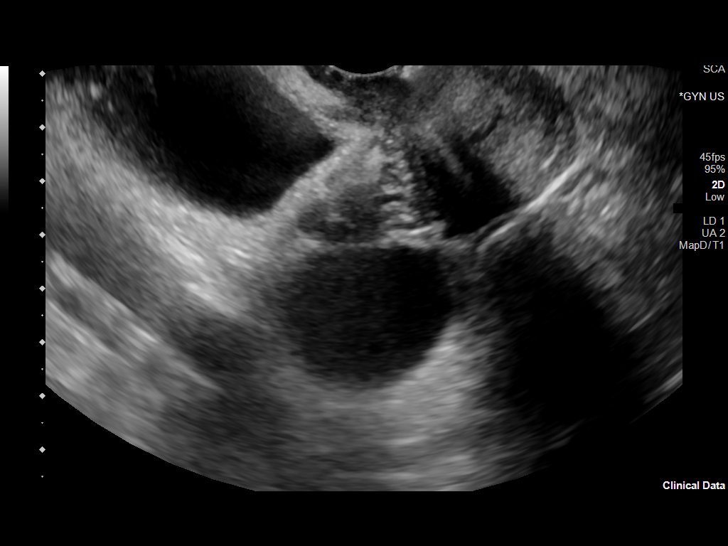

[Series 1001: us pelvis complete transabd/transvag w duplex and/ · 1 of 2 slices shown (2 of 2)]
[im 1/2]
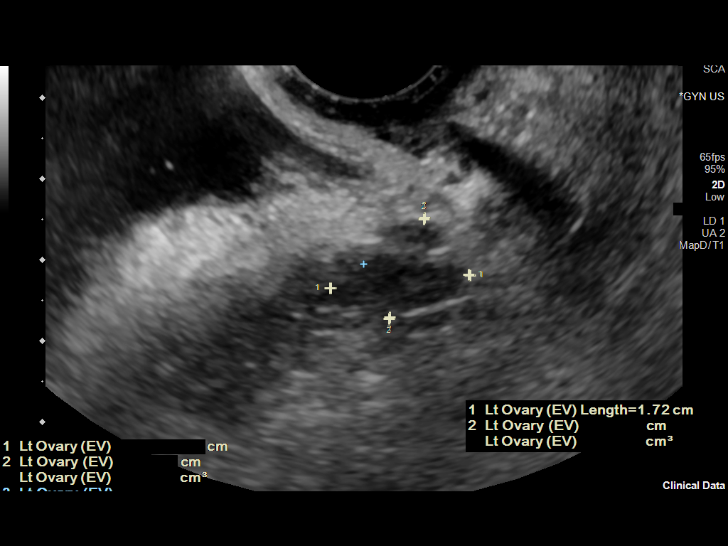

[13 of 25 positions shown; findings below may reference images not displayed]

FINDINGS: Uterus

Measurements: 7.8 x 2.9 x 5.0 cm = volume: 59 mL. Anteverted. Normal
morphology without mass

Endometrium

Thickness: 7 mm.  No endometrial fluid or mass

Right ovary

Measurements: 1.1 x 2.7 x 3.3 cm = volume: 19.1 mL. 3.5 cm right
ovarian benign functional cyst. No follow-up imaging is recommended.
Reference: Radiology [DATE]):[id number] blood flow present
within RIGHT ovary on color Doppler imaging.

Left ovary

Measurements: 1.7 x 1.3 x 1.2 cm = volume: 1.4 mL. Normal
appearance/no adnexal mass.

Pulsed Doppler evaluation of both ovaries demonstrates normal
low-resistance arterial and venous waveforms.

Other findings

No free pelvic fluid.  No adnexal masses.
IMPRESSION: 3.5 cm diameter simple cyst RIGHT ovary; no follow-up imaging
recommended, as above.

Otherwise normal exam.

No sonographic evidence of RIGHT ovarian torsion.

## 2024-04-09 IMAGING — CT CT RENAL STONE PROTOCOL
2 of 4 series · 17 of 46 positions shown, 19 images · non-contrast
Comparison: None.

CLINICAL DATA: Right lower quadrant pain



[Series 2: axial st · axial · 0.98mm/px · z∈[-757,-357]mm · 14 of 88 slices shown, 16 images]
[im 4/88  soft-tissue]
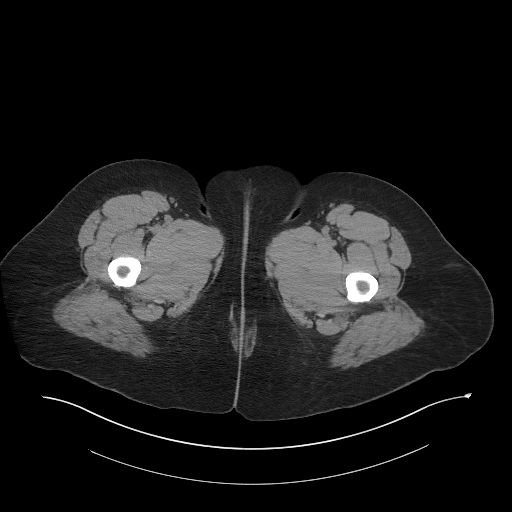
[im 4/88  bone]
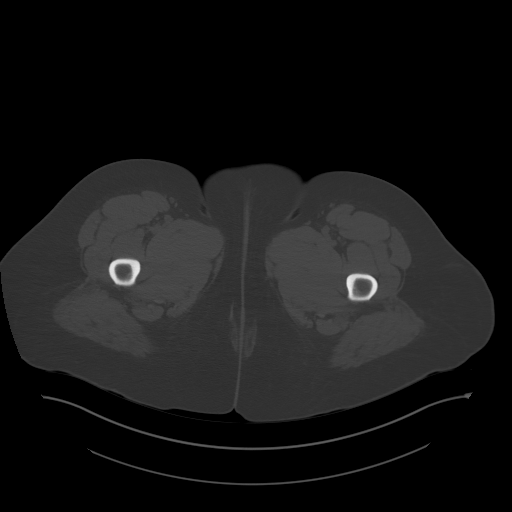
[im 12/88  soft-tissue]
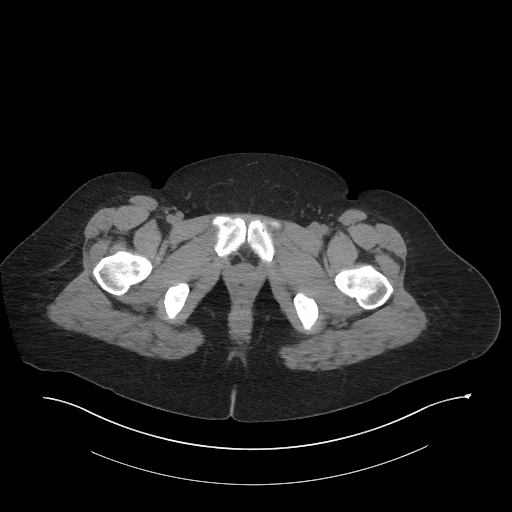
[im 16/88  soft-tissue]
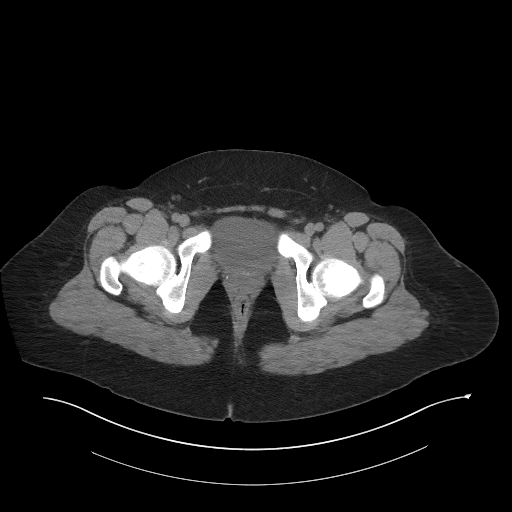
[im 23/88  soft-tissue]
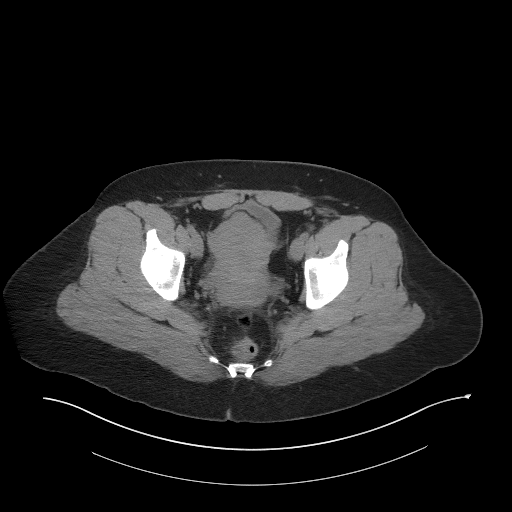
[im 31/88  soft-tissue]
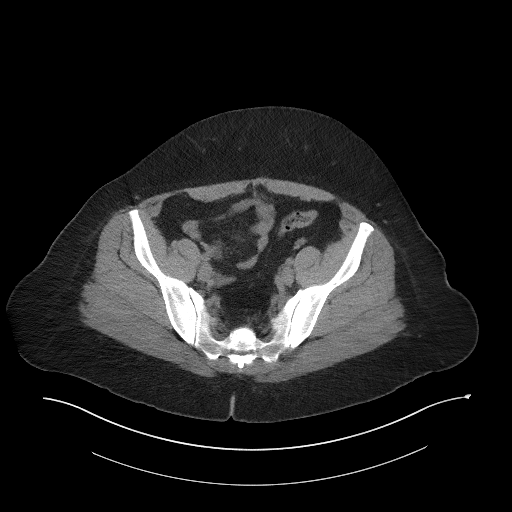
[im 35/88  soft-tissue]
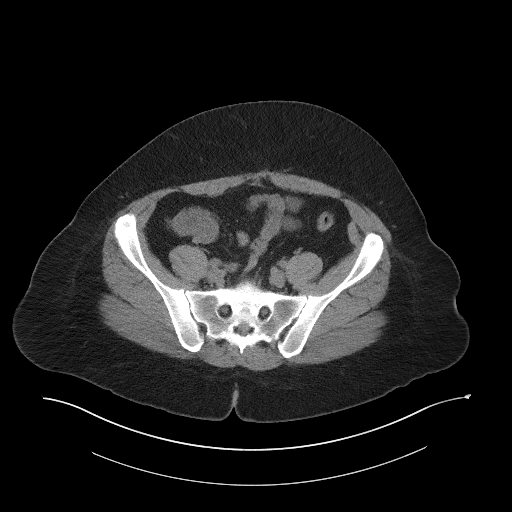
[im 42/88  soft-tissue]
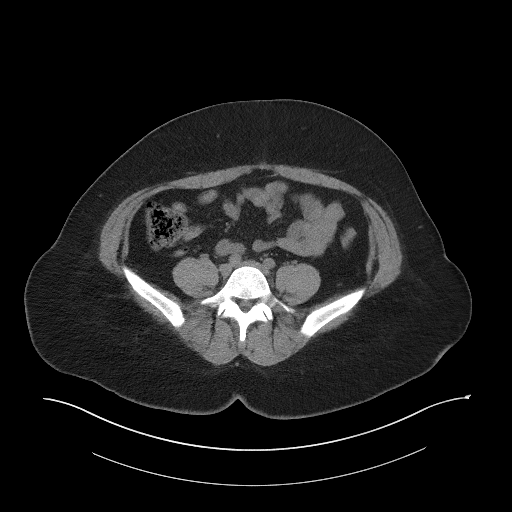
[im 46/88  soft-tissue]
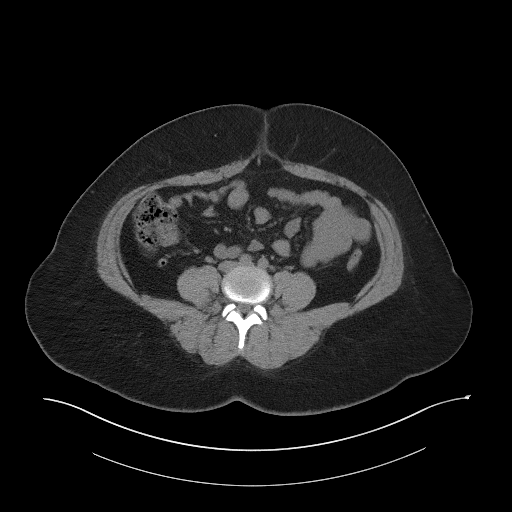
[im 53/88  soft-tissue]
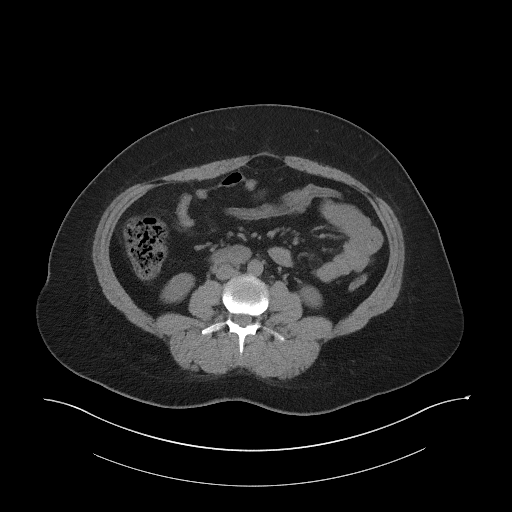
[im 53/88  bone]
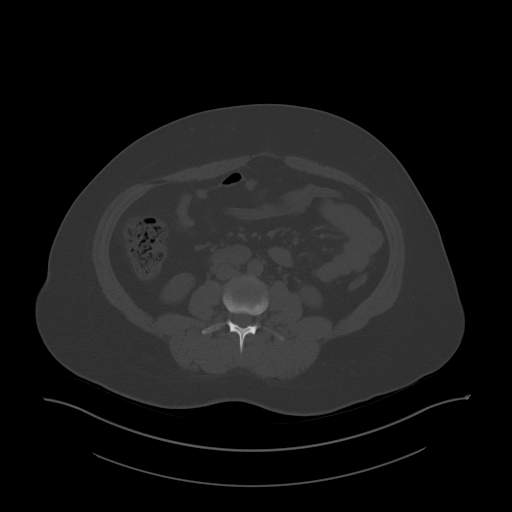
[im 57/88  soft-tissue]
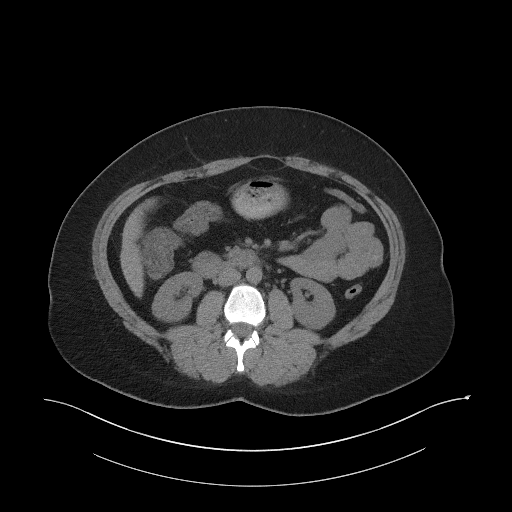
[im 65/88  soft-tissue]
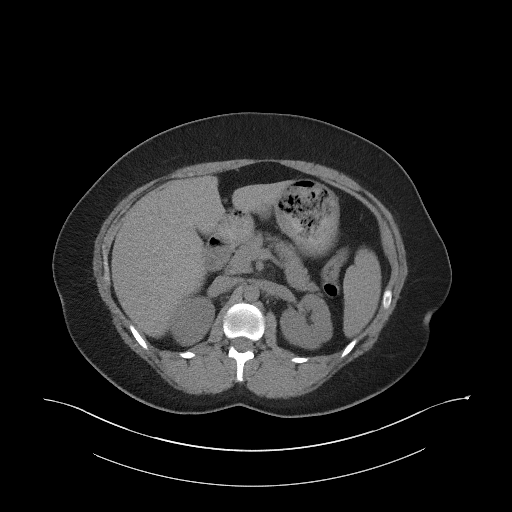
[im 72/88  soft-tissue]
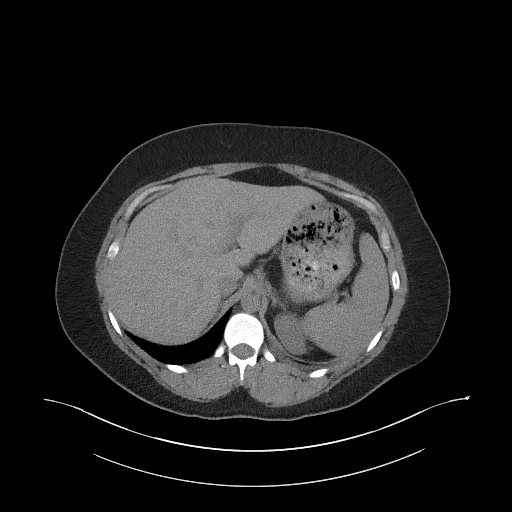
[im 76/88  soft-tissue]
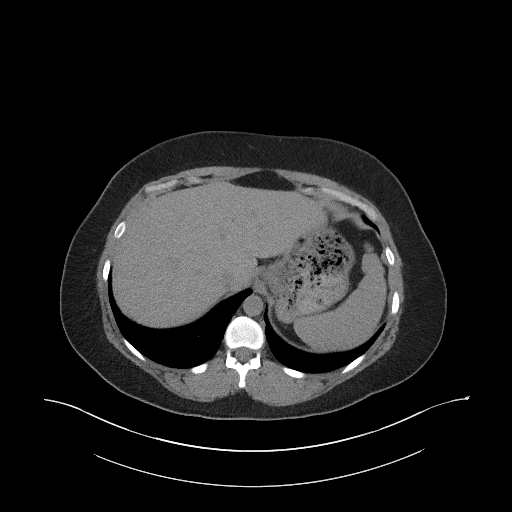
[im 84/88  soft-tissue]
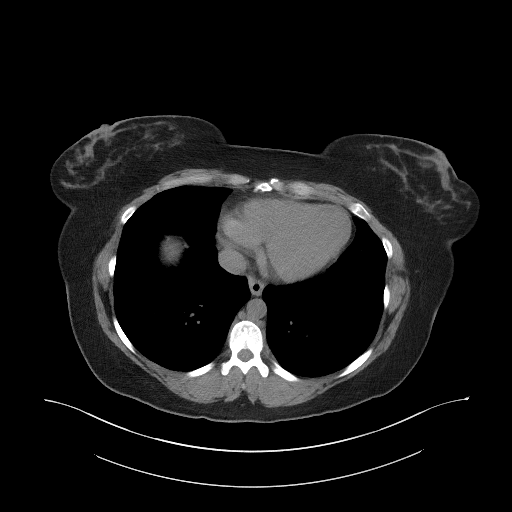

[Series 5: coronal st · coronal · 0.97mm/px · 3 of 118 slices shown]
[im 40/118  soft-tissue]
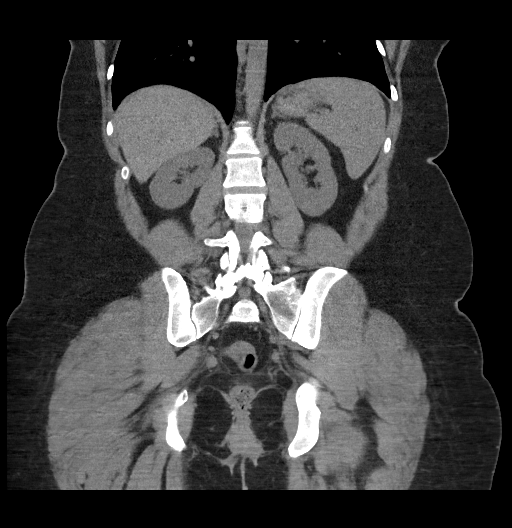
[im 53/118  soft-tissue]
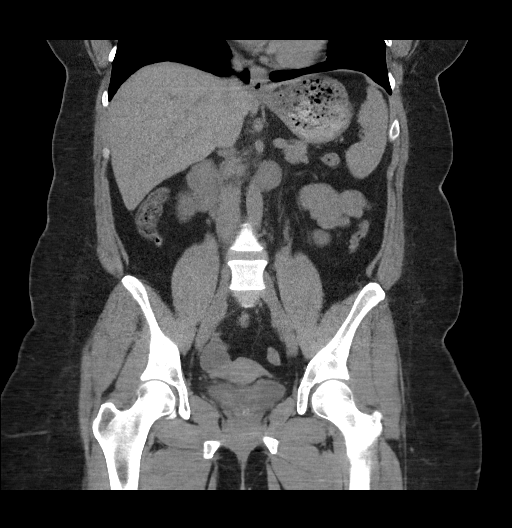
[im 66/118  soft-tissue]
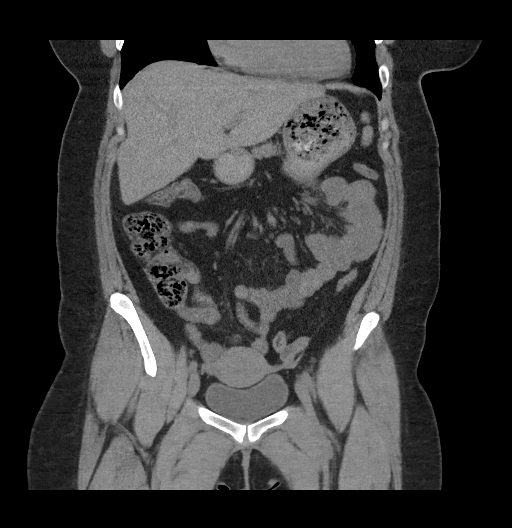

[17 of 46 positions shown; findings below may reference images not displayed]

FINDINGS: Lower chest: No acute abnormality.

Hepatobiliary: No focal liver abnormality is seen. Status post
cholecystectomy. No biliary dilatation.

Pancreas: Unremarkable. No pancreatic ductal dilatation or
surrounding inflammatory changes.

Spleen: Normal in size without focal abnormality.

Adrenals/Urinary Tract: Adrenal glands are unremarkable. Kidneys are
normal, without renal calculi, focal lesion, or hydronephrosis.
Bladder is unremarkable.

Stomach/Bowel: Stomach is within normal limits. Appendix appears
normal. No evidence of bowel wall thickening, distention, or
inflammatory changes.

Vascular/Lymphatic: No significant vascular findings are present. No
enlarged abdominal or pelvic lymph nodes.

Reproductive: Uterus is unremarkable. Right adnexal cyst measuring
3.3 cm.

Other: No abdominal wall hernia or abnormality. No abdominopelvic
ascites.

Musculoskeletal: No acute or significant osseous findings.
IMPRESSION: 1. No acute findings in the abdomen or pelvis, including no evidence
of obstructive uropathy.
2. Right adnexal cyst measuring 3.3 cm, likely physiologic with no
further follow-up imaging recommended. Finding can be a cause of
right lower quadrant pain.

## 2024-08-22 ENCOUNTER — Other Ambulatory Visit: Payer: Self-pay

## 2024-08-22 ENCOUNTER — Encounter (HOSPITAL_COMMUNITY): Admission: EM | Payer: Self-pay | Source: Home / Self Care

## 2024-08-22 ENCOUNTER — Inpatient Hospital Stay (HOSPITAL_BASED_OUTPATIENT_CLINIC_OR_DEPARTMENT_OTHER)
Admission: EM | Admit: 2024-08-22 | Payer: Worker's Compensation | Source: Home / Self Care | Admitting: Internal Medicine

## 2024-08-22 ENCOUNTER — Encounter (HOSPITAL_BASED_OUTPATIENT_CLINIC_OR_DEPARTMENT_OTHER): Payer: Self-pay

## 2024-08-22 ENCOUNTER — Inpatient Hospital Stay (HOSPITAL_COMMUNITY): Payer: Worker's Compensation | Admitting: Anesthesiology

## 2024-08-22 ENCOUNTER — Emergency Department (HOSPITAL_BASED_OUTPATIENT_CLINIC_OR_DEPARTMENT_OTHER): Payer: Worker's Compensation

## 2024-08-22 DIAGNOSIS — S61452A Open bite of left hand, initial encounter: Principal | ICD-10-CM

## 2024-08-22 DIAGNOSIS — L089 Local infection of the skin and subcutaneous tissue, unspecified: Secondary | ICD-10-CM | POA: Diagnosis present

## 2024-08-22 DIAGNOSIS — G90A Postural orthostatic tachycardia syndrome (POTS): Secondary | ICD-10-CM | POA: Insufficient documentation

## 2024-08-22 LAB — BASIC METABOLIC PANEL WITH GFR
Anion gap: 12 (ref 5–15)
BUN: 10 mg/dL (ref 6–20)
CO2: 25 mmol/L (ref 22–32)
Calcium: 9.3 mg/dL (ref 8.9–10.3)
Chloride: 102 mmol/L (ref 98–111)
Creatinine, Ser: 0.66 mg/dL (ref 0.44–1.00)
GFR, Estimated: >60 mL/min
Glucose, Bld: 134 mg/dL — ABNORMAL HIGH (ref 70–99)
Potassium: 3.6 mmol/L (ref 3.5–5.1)
Sodium: 138 mmol/L (ref 135–145)

## 2024-08-22 LAB — CBC
HCT: 42.4 % (ref 36.0–46.0)
Hemoglobin: 14 g/dL (ref 12.0–15.0)
MCH: 28.6 pg (ref 26.0–34.0)
MCHC: 33 g/dL (ref 30.0–36.0)
MCV: 86.5 fL (ref 80.0–100.0)
Platelets: 310 10*3/uL (ref 150–400)
RBC: 4.9 MIL/uL (ref 3.87–5.11)
RDW: 12.9 % (ref 11.5–15.5)
WBC: 8.2 10*3/uL (ref 4.0–10.5)
nRBC: 0 % (ref 0.0–0.2)

## 2024-08-22 LAB — HCG, QUANTITATIVE, PREGNANCY: hCG, Beta Chain, Quant, S: <1 m[IU]/mL

## 2024-08-22 MED ORDER — DIPHENHYDRAMINE HCL 50 MG/ML IJ SOLN
INTRAMUSCULAR | Status: DC | PRN
  Administered 2024-08-22: 12.5 mg via INTRAVENOUS

## 2024-08-22 MED ORDER — LIDOCAINE 2% (20 MG/ML) 5 ML SYRINGE
INTRAMUSCULAR | Status: DC | PRN
  Administered 2024-08-22: 60 mg via INTRAVENOUS

## 2024-08-22 MED ORDER — MIDODRINE HCL 5 MG PO TABS: 5.0000 mg | ORAL_TABLET | Freq: Three times a day (TID) | ORAL | Status: AC

## 2024-08-22 MED ORDER — NALOXONE HCL 0.4 MG/ML IJ SOLN: 0.4000 mg | INTRAMUSCULAR | Status: AC | PRN

## 2024-08-22 MED ORDER — ONDANSETRON HCL 4 MG PO TABS: 4.0000 mg | ORAL_TABLET | Freq: Four times a day (QID) | ORAL | Status: AC | PRN

## 2024-08-22 MED ORDER — FENTANYL CITRATE (PF) 50 MCG/ML IJ SOSY
12.5000 ug | PREFILLED_SYRINGE | INTRAMUSCULAR | Status: AC | PRN
  Administered 2024-08-22: 12.5 ug via INTRAVENOUS
  Filled 2024-08-22: qty 1

## 2024-08-22 MED ORDER — MIDAZOLAM HCL 2 MG/2ML IJ SOLN
INTRAMUSCULAR | Status: AC
  Filled 2024-08-22: qty 2

## 2024-08-22 MED ORDER — ONDANSETRON HCL 4 MG/2ML IJ SOLN
4.0000 mg | Freq: Once | INTRAMUSCULAR | Status: AC
  Administered 2024-08-22: 4 mg via INTRAVENOUS
  Filled 2024-08-22: qty 2

## 2024-08-22 MED ORDER — LIDOCAINE HCL (PF) 1 % IJ SOLN
INTRAMUSCULAR | Status: AC
  Filled 2024-08-22: qty 30

## 2024-08-22 MED ORDER — METRONIDAZOLE 500 MG/100ML IV SOLN
500.0000 mg | Freq: Once | INTRAVENOUS | Status: DC
  Filled 2024-08-22: qty 100

## 2024-08-22 MED ORDER — VENLAFAXINE HCL ER 75 MG PO CP24
150.0000 mg | ORAL_CAPSULE | Freq: Every day | ORAL | Status: AC
  Filled 2024-08-22: qty 1

## 2024-08-22 MED ORDER — ACETAMINOPHEN 10 MG/ML IV SOLN
INTRAVENOUS | Status: DC | PRN
  Administered 2024-08-22: 1000 mg via INTRAVENOUS

## 2024-08-22 MED ORDER — ACETAMINOPHEN 650 MG RE SUPP: 650.0000 mg | Freq: Four times a day (QID) | RECTAL | Status: AC | PRN

## 2024-08-22 MED ORDER — BISACODYL 10 MG RE SUPP: 10.0000 mg | Freq: Every day | RECTAL | Status: AC | PRN

## 2024-08-22 MED ORDER — PROPOFOL 10 MG/ML IV BOLUS
INTRAVENOUS | Status: AC
  Filled 2024-08-22: qty 20

## 2024-08-22 MED ORDER — HYDROMORPHONE HCL 1 MG/ML IJ SOLN
1.0000 mg | Freq: Once | INTRAMUSCULAR | Status: AC
  Administered 2024-08-22: 1 mg via INTRAVENOUS
  Filled 2024-08-22: qty 1

## 2024-08-22 MED ORDER — SODIUM CHLORIDE 0.9 % IR SOLN
Status: DC | PRN
  Administered 2024-08-22: 3000 mL

## 2024-08-22 MED ORDER — POLYETHYLENE GLYCOL 3350 17 G PO PACK: 17.0000 g | PACK | Freq: Every day | ORAL | Status: AC | PRN

## 2024-08-22 MED ORDER — ACETAMINOPHEN 325 MG PO TABS: 650.0000 mg | ORAL_TABLET | Freq: Four times a day (QID) | ORAL | Status: AC | PRN

## 2024-08-22 MED ORDER — LACTATED RINGERS IV SOLN: INTRAVENOUS | Status: DC | PRN

## 2024-08-22 MED ORDER — PROPOFOL 10 MG/ML IV BOLUS
INTRAVENOUS | Status: DC | PRN
  Administered 2024-08-22: 200 mg via INTRAVENOUS

## 2024-08-22 MED ORDER — ONDANSETRON HCL 4 MG/2ML IJ SOLN: 4.0000 mg | Freq: Four times a day (QID) | INTRAMUSCULAR | Status: AC | PRN

## 2024-08-22 MED ORDER — FENTANYL CITRATE (PF) 50 MCG/ML IJ SOSY
50.0000 ug | PREFILLED_SYRINGE | Freq: Once | INTRAMUSCULAR | Status: AC
  Administered 2024-08-22: 50 ug via INTRAVENOUS
  Filled 2024-08-22: qty 1

## 2024-08-22 MED ORDER — ALBUTEROL SULFATE (2.5 MG/3ML) 0.083% IN NEBU: 2.5000 mg | INHALATION_SOLUTION | RESPIRATORY_TRACT | Status: AC | PRN

## 2024-08-22 MED ORDER — 0.9 % SODIUM CHLORIDE (POUR BTL) OPTIME
TOPICAL | Status: DC | PRN
  Administered 2024-08-22: 1000 mL

## 2024-08-22 MED ORDER — MIDAZOLAM HCL (PF) 2 MG/2ML IJ SOLN
INTRAMUSCULAR | Status: DC | PRN
  Administered 2024-08-22: 2 mg via INTRAVENOUS

## 2024-08-22 MED ORDER — DEXAMETHASONE SOD PHOSPHATE PF 10 MG/ML IJ SOLN
INTRAMUSCULAR | Status: DC | PRN
  Administered 2024-08-22: 10 mg via INTRAVENOUS

## 2024-08-22 MED ORDER — EPHEDRINE SULFATE-NACL 50-0.9 MG/10ML-% IV SOSY
PREFILLED_SYRINGE | INTRAVENOUS | Status: DC | PRN
  Administered 2024-08-22: 5 mg via INTRAVENOUS

## 2024-08-22 MED ORDER — SODIUM CHLORIDE 0.9 % IV SOLN: INTRAVENOUS | Status: AC

## 2024-08-22 MED ORDER — OXYCODONE HCL 5 MG PO TABS: 5.0000 mg | ORAL_TABLET | ORAL | Status: AC | PRN

## 2024-08-22 MED ORDER — PROPRANOLOL HCL 20 MG PO TABS
20.0000 mg | ORAL_TABLET | Freq: Two times a day (BID) | ORAL | Status: AC
  Administered 2024-08-22: 20 mg via ORAL
  Filled 2024-08-22: qty 1

## 2024-08-22 MED ORDER — HYDROMORPHONE HCL 1 MG/ML IJ SOLN
0.2500 mg | INTRAMUSCULAR | Status: DC | PRN
  Administered 2024-08-22 (×2): 0.5 mg via INTRAVENOUS

## 2024-08-22 MED ORDER — LIDOCAINE HCL 1 % IJ SOLN
INTRAMUSCULAR | Status: DC | PRN
  Administered 2024-08-22: 20 mL

## 2024-08-22 MED ORDER — ENOXAPARIN SODIUM 40 MG/0.4ML IJ SOSY: 40.0000 mg | PREFILLED_SYRINGE | INTRAMUSCULAR | Status: AC

## 2024-08-22 MED ORDER — LEVOFLOXACIN IN D5W 750 MG/150ML IV SOLN
750.0000 mg | Freq: Once | INTRAVENOUS | Status: AC
  Administered 2024-08-22: 750 mg via INTRAVENOUS
  Filled 2024-08-22: qty 150

## 2024-08-22 MED ORDER — SODIUM CHLORIDE 0.9 % IV SOLN
1.0000 g | Freq: Three times a day (TID) | INTRAVENOUS | Status: AC
  Administered 2024-08-22: 1 g via INTRAVENOUS
  Filled 2024-08-22 (×3): qty 20

## 2024-08-22 MED ORDER — ONDANSETRON HCL 4 MG/2ML IJ SOLN
INTRAMUSCULAR | Status: DC | PRN
  Administered 2024-08-22: 4 mg via INTRAVENOUS

## 2024-08-22 MED ORDER — HYDROMORPHONE HCL 1 MG/ML IJ SOLN
INTRAMUSCULAR | Status: AC
  Filled 2024-08-22: qty 1

## 2024-08-22 NOTE — H&P (Signed)
 " Telemedicine History and Physical     Referring Provider: Susette Lash PA-C Telemedicine Provider: Donalda Applebaum MD Provider Location: [facility] Patient Location: [facility] Referring Diagnosis: Cate bite purulent cellulitis Patient Name and DOB verified: yes Patient consented to Telemedicine Evaluation:yes RN virtual assistant: Erla Haste Video encounter time and date:08/22/2024   Patient: Amy Lutz FMW:[id number] DOB: 01-Apr-1991 PCP: Alray Loader, MD    Chief Complaint:  Chief Complaint  Patient presents with   Animal Bite   HPI: Amy Lutz is a 34 y.o. female with history of POTS y-who presented to [facility] earlier today after sustaining a cat bite to her left hand-this apparently occurred last night.  She apparently went to a local urgent care and was given doxycycline and clindamycin.  Unfortunately when she woke up this morning-she had much more swelling on her left thumb area/thenar eminence-with worsening erythema tracking up her forearm.  She also noticed some purulence coming out of her base of the left thumb.  She went back to urgent care and was promptly referred to ED.    She subsequently presented to the ED today-she was found to have mild leukocytosis-EDP discussed with orthopedics (Michael Jeffries-PA-C)-who advised to keep n.p.o. and transferred to [facility] for potential debridement in the operating room.  Per history obtained-patient is a international aid/development worker technician-and her clients cat is up-to-date with rabies vaccine, she also is up-to-date with the prophylactic rabies vaccine.   No fever No headache No chest pain No shortness of breath No abdominal pain No nausea vomiting No diarrhea No hematuria No hematochezia No melena No dysuria   Review of Systems: As mentioned in the history of present illness. All other systems reviewed and are negative. Past Medical History:  Diagnosis Date   ADHD (attention deficit hyperactivity disorder)    Anxiety     Depression    Miscarriage    Pyelonephritis    Past Surgical History:  Procedure Laterality Date   CHOLECYSTECTOMY     KNEE ARTHROSCOPY     OVARIAN CYST SURGERY     WISDOM TOOTH EXTRACTION     Social History:  reports that she has never smoked. She has never used smokeless tobacco. She reports current alcohol use. She reports current drug use.  Allergies[1]  History reviewed. No pertinent family history.  Prior to Admission medications  Medication Sig Start Date End Date Taking? Authorizing Provider  amphetamine-dextroamphetamine (ADDERALL XR) 20 MG 24 hr capsule Take 20 mg by mouth daily. 08/19/20   [provider]  etonogestrel (NEXPLANON) 68 MG IMPL implant 1 each by Subdermal route once. 09/30/19 09/15/22  [provider]  ondansetron  (ZOFRAN -ODT) 4 MG disintegrating tablet Take 1 tablet (4 mg total) by mouth every 8 (eight) hours as needed. 03/21/23   Ruthe Cornet, DO    Physical Exam: done by Erla Haste RN during this encounter Vitals:   08/22/24 1036  BP: 132/80  Pulse: 85  Resp: 16  Temp: 97.9 F (36.6 C)  TempSrc: Oral  SpO2: 98%    Gen Exam:Alert awake-not in any distress HEENT:atraumatic, normocephalic Chest: B/L clear to auscultation anteriorly CVS:S1S2 regular Abdomen:soft non tender, non distended Extremities:no edema-see pics below Neurology: Non focal Skin: no rash        Data Reviewed:     Latest Ref Rng & Units 08/22/2024   11:15 AM 07/23/2023    4:52 PM 11/09/2021   11:05 AM  CBC  WBC 4.0 - 10.5 K/uL 8.2  11.1  9.2   Hemoglobin  12.0 - 15.0 g/dL 85.9  85.6  85.8   Hematocrit 36.0 - 46.0 % 42.4  43.1  42.3   Platelets 150 - 400 K/uL 310  337  328         Latest Ref Rng & Units 08/22/2024   11:15 AM 07/23/2023    4:52 PM 11/09/2021   11:05 AM  BMP  Glucose 70 - 99 mg/dL 865  98  99   BUN 6 - 20 mg/dL 10  10  9    Creatinine 0.44 - 1.00 mg/dL 9.33  9.29  9.13   Sodium 135 - 145 mmol/L 138  137  138   Potassium 3.5 - 5.1  mmol/L 3.6  3.6  3.4   Chloride 98 - 111 mmol/L 102  105  104   CO2 22 - 32 mmol/L 25  23  25    Calcium 8.9 - 10.3 mg/dL 9.3  9.4  9.1      Assessment and Plan: Left hand soft tissue infection with with purulent drainage following a cat bite Continue n.p.o. status Has shortness of breath/hives with penicillin-will start meropenem -she was given Levaquin /Flagyl  here in the emergency room. Discussed with [facility]-patient to be transported to [facility] for urgent orthopedic evaluation. Per history obtained-cat is up-to-date with rabies vaccine.  POTS Manages with excessive salt and hydration Also on midodrine  and propranolol  which will be continued Need to ensure that she is hydrated with IV fluids during this hospitalization  Note-urine pregnancy test pending at the time of this dictation.    Advance Care Planning:   Code Status: Full Code   Consults: Hand surgery Tatiana Kenner, PA-C and Dr. Arlinda MD)  Family Communication: None  Severity of Illness: The appropriate patient status for this patient is INPATIENT. Inpatient status is judged to be reasonable and necessary in order to provide the required intensity of service to ensure the patient's safety. The patient's presenting symptoms, physical exam findings, and initial radiographic and laboratory data in the context of their chronic comorbidities is felt to place them at high risk for further clinical deterioration. Furthermore, it is not anticipated that the patient will be medically stable for discharge from the hospital within 2 midnights of admission.   * I certify that at the point of admission it is my clinical judgment that the patient will require inpatient hospital care spanning beyond 2 midnights from the point of admission due to high intensity of service, high risk for further deterioration and high frequency of surveillance required.*  Author: Donalda Applebaum, MD 08/22/2024 1:26 PM  For on call review  [email or web address].      [1]  Allergies Allergen Reactions   Amoxicillin  Hives and Shortness Of Breath    Patient had a cat bite.  She was taking doxycycline which was a stranger's for Bite.  I question if she had any antibiotic allergies and she told me she has allergy to amoxicillin .  When questioned what symptoms she gets from that she states she gets hives and difficulty breathing.   Iodinated Contrast Media Anaphylaxis   Morphine And Codeine Other (See Comments)    Increases pain   Strawberry Extract Hives   Sulfa Antibiotics Rash   Sulfamethoxazole-Trimethoprim Rash   "

## 2024-08-22 NOTE — Progress Notes (Addendum)
.. ° ° °  PROCEDURAL EXPEDITER PROGRESS NOTE  Patient Name: Amy Lutz  DOB:03-20-1991 Date of Admission: 08/22/2024  Date of Assessment:08/22/24   -------------------------------------------------------------------------------------------------------------------   Brief clinical summary: Pt to the OR today for I&D of wounds on left hand  Orders in place:  No   Communication with surgical team if no orders: Orders will be placed once pt is transferred to [facility] and a formal consult has been done.   Labs, test, and orders reviewed: Y - needs preg test  Requires surgical clearance:  No  Barriers noted: Needs preg test   Intervention provided by [facility] team: order for preg test placed. Reached out to ED nurse to make them area it was placed and it will need to be resulted before she goes to surgery tonight. ED nurse reached out to say they placed HCG quantitative instead of the urine preg since they had already sent blood off to the lab. HCG quantitative is currently in process.   Barrier resolved:  yes   -------------------------------------------------------------------------------------------------------------------  [facility] [place], [place], [place] Please contact us  directly via secure chat (search for [facility]) or by calling us  at [phone] [facility]).

## 2024-08-22 NOTE — ED Notes (Signed)
 ED TO INPATIENT HANDOFF REPORT  ED Nurse Name and Phone #: Thornell Herring, RN [phone]  S Name/Age/Gender Amy Lutz 34 y.o. female Room/Bed: MHFT1/MHFT1  Code Status   Code Status: Full Code  Home/SNF/Other Home Patient oriented to: self, place, time, and situation Is this baseline? Yes   Triage Complete: Triage complete  Chief Complaint Cat bite of finger [S61.259A, W55.01XA]  Triage Note Multiple cat bites yesterday. Multiple puncture wounds and scratch marks noted to L wrist. Redness and swelling at base of L thumb. Reports pus drainage from bse of thumb.   Started on abx bby UC yesterday  Cat up to date on rabies vaccine   Allergies Allergies[1]  Level of Care/Admitting Diagnosis ED Disposition     ED Disposition  Admit   Condition  --   Comment  Hospital Area: [facility] [[id number]]  Level of Care: Med-Surg [16]  May admit patient to [facility] or [facility] if equivalent level of care is available:: No  Interfacility transfer: Yes  Diagnosis: Cat bite of finger [252925]  Admitting Physician: RAENELLE DONALDA HERO [[id number]]  Attending Physician: RAENELLE DONALDA HERO [[id number]]  Certification:: I certify this patient will need inpatient services for at least 2 midnights  Expected Medical Readiness: 08/24/2024          B Medical/Surgery History Past Medical History:  Diagnosis Date   ADHD (attention deficit hyperactivity disorder)    Anxiety    Depression    Miscarriage    Pyelonephritis    Past Surgical History:  Procedure Laterality Date   CHOLECYSTECTOMY     KNEE ARTHROSCOPY     OVARIAN CYST SURGERY     WISDOM TOOTH EXTRACTION       A IV Location/Drains/Wounds Patient Lines/Drains/Airways Status     Active Line/Drains/Airways     Name Placement date Placement time Site Days   Peripheral IV 08/22/24 20 G 1 Right Antecubital 08/22/24  1242  Antecubital  less than 1            Intake/Output Last 24 hours No intake  or output data in the 24 hours ending 08/22/24 1342  Labs/Imaging Results for orders placed or performed during the hospital encounter of 08/22/24 (from the past 48 hours)  CBC     Status: None   Collection Time: 08/22/24 11:15 AM  Result Value Ref Range   WBC 8.2 4.0 - 10.5 K/uL   RBC 4.90 3.87 - 5.11 MIL/uL   Hemoglobin 14.0 12.0 - 15.0 g/dL   HCT 57.5 63.9 - 53.9 %   MCV 86.5 80.0 - 100.0 fL   MCH 28.6 26.0 - 34.0 pg   MCHC 33.0 30.0 - 36.0 g/dL   RDW 87.0 88.4 - 84.4 %   Platelets 310 150 - 400 K/uL   nRBC 0.0 0.0 - 0.2 %    Comment: Performed at [facility], [address] [place], [place] [address]  Basic metabolic panel     Status: Abnormal   Collection Time: 08/22/24 11:15 AM  Result Value Ref Range   Sodium 138 135 - 145 mmol/L   Potassium 3.6 3.5 - 5.1 mmol/L   Chloride 102 98 - 111 mmol/L   CO2 25 22 - 32 mmol/L   Glucose, Bld 134 (H) 70 - 99 mg/dL    Comment: Glucose reference range applies only to samples taken after fasting for at least 8 hours.   BUN 10 6 - 20 mg/dL   Creatinine, Ser  0.66 0.44 - 1.00 mg/dL   Calcium 9.3 8.9 - 89.6 mg/dL   GFR, Estimated >39 >39 mL/min    Comment: (NOTE) Calculated using the CKD-EPI Creatinine Equation (2021)    Anion gap 12 5 - 15    Comment: Performed at [facility], [address] [place], [place] [address]   DG Wrist Complete Left Result Date: 08/22/2024 CLINICAL DATA:  Multiple cat bites EXAM: LEFT WRIST - COMPLETE 4 VIEW COMPARISON:  None Available. FINDINGS: There is no evidence of fracture or dislocation. There is no evidence of arthropathy or other focal bone abnormality. Soft tissue thickening overlying the dorsal distal forearm. No radiopaque foreign body. IMPRESSION: Soft tissue thickening overlying the dorsal distal forearm. No radiopaque foreign body. No acute osseous abnormality. Electronically Signed   By: Limin  Xu M.D.   On: 08/22/2024 12:13    Pending Labs Unresulted Labs (From  admission, onward)     Start     Ordered   08/22/24 1325  hCG, quantitative, pregnancy  Once,   URGENT        08/22/24 1324   08/22/24 1314  Blood culture (routine x 2)  BLOOD CULTURE X 2,   STAT      08/22/24 1313   Signed and Held  HIV Antibody (routine testing w rflx)  (HIV Antibody (Routine testing w reflex) panel)  Once,   R        Signed and Held   Signed and Held  CBC  (enoxaparin  (LOVENOX )    CrCl < 30 ml/min)  Once,   R       Comments: Baseline for enoxaparin  therapy IF NOT ALREADY DRAWN.  Notify MD if PLT < 100 K.    Signed and Held   Signed and Held  Creatinine, serum  (enoxaparin  (LOVENOX )    CrCl < 30 ml/min)  Once,   R       Comments: Baseline for enoxaparin  therapy IF NOT ALREADY DRAWN.    Signed and Held   Signed and Held  Creatinine, serum  (enoxaparin  (LOVENOX )    CrCl < 30 ml/min)  Once,   R       Comments: while on enoxaparin  therapy.    Signed and Held   Signed and Held  CBC  Tomorrow morning,   R        Signed and Held   Signed and Held  Basic metabolic panel  Tomorrow morning,   R        Signed and Held            Vitals/Pain Today's Vitals   08/22/24 1036 08/22/24 1036 08/22/24 1115  BP:  132/80   Pulse:  85   Resp:  16   Temp:  97.9 F (36.6 C)   TempSrc:  Oral   SpO2:  98%   PainSc: 8   8     Isolation Precautions No active isolations  Medications Medications  levofloxacin  (LEVAQUIN ) IVPB 750 mg (750 mg Intravenous New Bag/Given 08/22/24 1257)  metroNIDAZOLE  (FLAGYL ) IVPB 500 mg (has no administration in time range)  acetaminophen  (TYLENOL ) tablet 650 mg (has no administration in time range)    Or  acetaminophen  (TYLENOL ) suppository 650 mg (has no administration in time range)  oxyCODONE  (Oxy IR/ROXICODONE ) immediate release tablet 5 mg (has no administration in time range)  fentaNYL  (SUBLIMAZE ) injection 12.5 mcg (has no administration in time range)  naloxone  (NARCAN ) injection 0.4 mg (has no administration in time range)  bisacodyl   (DULCOLAX) suppository 10 mg (has no administration in time range)  polyethylene glycol (MIRALAX  / GLYCOLAX ) packet 17 g (has no administration in time range)  ondansetron  (ZOFRAN ) tablet 4 mg (has no administration in time range)    Or  ondansetron  (ZOFRAN ) injection 4 mg (has no administration in time range)  meropenem  (MERREM ) 1 g in sodium chloride  0.9 % 100 mL IVPB (has no administration in time range)  ondansetron  (ZOFRAN ) injection 4 mg (4 mg Intravenous Given 08/22/24 1252)  fentaNYL  (SUBLIMAZE ) injection 50 mcg (50 mcg Intravenous Given 08/22/24 1253)    Mobility walks     Focused Assessments    R Recommendations: See Admitting Provider Note  Report given to:    Additional Notes:        [1]  Allergies Allergen Reactions   Amoxicillin  Hives and Shortness Of Breath    Patient had a cat bite.  She was taking doxycycline which was a stranger's for Bite.  I question if she had any antibiotic allergies and she told me she has allergy to amoxicillin .  When questioned what symptoms she gets from that she states she gets hives and difficulty breathing.   Iodinated Contrast Media Anaphylaxis   Morphine And Codeine Other (See Comments)    Increases pain   Strawberry Extract Hives   Sulfa Antibiotics Rash   Sulfamethoxazole-Trimethoprim Rash

## 2024-08-22 NOTE — ED Triage Notes (Signed)
 Multiple cat bites yesterday. Multiple puncture wounds and scratch marks noted to L wrist. Redness and swelling at base of L thumb. Reports pus drainage from bse of thumb.   Started on abx bby UC yesterday  Cat up to date on rabies vaccine

## 2024-08-22 NOTE — ED Provider Notes (Cosign Needed)
 " [facility] Provider Note   CSN: [id number] Arrival date & time: 08/22/24  1005     Patient presents with: Animal Bite   Amy Lutz is a 34 y.o. female.   34 year old female who is right-hand dominant presents after a cat bite that occurred last night.  She was started on doxycycline but her infection has worsened since then with swelling, red streaks tracking up her arm and some purulent drainage from the wound at the base of her thumb.  She denies any fever.  No other injuries.  The history is provided by the patient. No language interpreter was used.       Prior to Admission medications  Medication Sig Start Date End Date Taking? Authorizing Provider  amphetamine-dextroamphetamine (ADDERALL XR) 20 MG 24 hr capsule Take 20 mg by mouth daily. 08/19/20   [provider]  etonogestrel (NEXPLANON) 68 MG IMPL implant 1 each by Subdermal route once. 09/30/19 09/15/22  [provider]  ondansetron  (ZOFRAN -ODT) 4 MG disintegrating tablet Take 1 tablet (4 mg total) by mouth every 8 (eight) hours as needed. 03/21/23   Curatolo, Adam, DO    Allergies: Iodinated contrast media, Amoxicillin , Morphine and codeine, Strawberry extract, Sulfa antibiotics, and Sulfamethoxazole-trimethoprim    Review of Systems  Constitutional:  Negative for fever.  Musculoskeletal:  Positive for arthralgias and joint swelling.  All other systems reviewed and are negative.   Updated Vital Signs BP 132/80 (BP Location: Right Arm)   Pulse 85   Temp 97.9 F (36.6 C) (Oral)   Resp 16   LMP 08/05/2024   SpO2 98%   Physical Exam Vitals and nursing note reviewed.  Constitutional:      General: She is not in acute distress.    Appearance: Normal appearance. She is not ill-appearing.  HENT:     Head: Normocephalic and atraumatic.     Nose: Nose normal.  Eyes:     Conjunctiva/sclera: Conjunctivae normal.  Pulmonary:     Effort: Pulmonary effort is  normal. No respiratory distress.  Musculoskeletal:        General: No deformity. Normal range of motion.     Comments: Range of motion in bilateral wrist however obvious puncture wounds noted to left hand.  Some streaking noted over the wrist joint to the distal forearm.  Neurovascularly intact.  See attached image for description.  Patient is able to fully flex and extend all digits.  Skin:    Findings: No rash.  Neurological:     Mental Status: She is alert.        (all labs ordered are listed, but only abnormal results are displayed) Labs Reviewed  BASIC METABOLIC PANEL WITH GFR - Abnormal; Notable for the following components:      Result Value   Glucose, Bld 134 (*)    All other components within normal limits  CBC    EKG: None  Radiology: No results found.   .Critical Care  Performed by: Hildegard Loge, PA-C Authorized by: Hildegard Loge, PA-C   Critical care provider statement:    Critical care time (minutes):  30   Critical care was necessary to treat or prevent imminent or life-threatening deterioration of the following conditions: Wound infection.   Critical care was time spent personally by me on the following activities:  Development of treatment plan with patient or surrogate, discussions with consultants, evaluation of patient's response to treatment, examination of patient, ordering and review of laboratory studies,  ordering and review of radiographic studies, ordering and performing treatments and interventions, pulse oximetry, re-evaluation of patient's condition and review of old charts    Medications Ordered in the ED  levofloxacin  (LEVAQUIN ) IVPB 750 mg (has no administration in time range)  metroNIDAZOLE  (FLAGYL ) IVPB 500 mg (has no administration in time range)  ondansetron  (ZOFRAN ) injection 4 mg (has no administration in time range)  fentaNYL  (SUBLIMAZE ) injection 50 mcg (has no administration in time range)                                    Medical  Decision Making Amount and/or Complexity of Data Reviewed Labs: ordered. Radiology: ordered.  Risk Prescription drug management. Decision regarding hospitalization.   Medical Decision Making / ED Course   This patient presents to the ED for concern of cat bite, this involves an extensive number of treatment options, and is a complaint that carries with it a high risk of complications and morbidity.  The differential diagnosis includes cellulitis, deeper wound infection,  MDM: 34 year old female who is right-hand dominant presents for cat bite that occurred last night.  Significant worsening since then.  No fever. CBC without leukocytosis.  BMP without acute concern. X-ray without retained foreign bodies or acute bony abnormality. After discussing antibiotic choice with pharmacist we settled on Levaquin  and Flagyl  for best coverage.  She states with amoxicillin  she has difficulty breathing and hives.  Patient discussed with Ozell [facility] who discussed the case with the hand surgeon.  They will evaluate patient once they arrive to [facility] will do recommend admission.  They recommend patient remain NPO.  Discussed with hospitalist.  They will accept patient for admission.  Realize that we did not obtain blood cultures.  These were ordered after the antibiotics were initiated.  Lab Tests: -I ordered, reviewed, and interpreted labs.   The pertinent results include:   Labs Reviewed  BASIC METABOLIC PANEL WITH GFR - Abnormal; Notable for the following components:      Result Value   Glucose, Bld 134 (*)    All other components within normal limits  CBC      EKG  EKG Interpretation Date/Time:    Ventricular Rate:    PR Interval:    QRS Duration:    QT Interval:    QTC Calculation:   R Axis:      Text Interpretation:           Imaging Studies ordered: I ordered imaging studies including no bony abnormality but soft tissue swelling noted I independently visualized  and interpreted imaging. I agree with the radiologist interpretation   Medicines ordered and prescription drug management: Meds ordered this encounter  Medications   levofloxacin  (LEVAQUIN ) IVPB 750 mg    Antibiotic Indication::   Wound Infection   metroNIDAZOLE  (FLAGYL ) IVPB 500 mg    Antibiotic Indication::   Other Indication (list below)    Other Indication::   wound infection   ondansetron  (ZOFRAN ) injection 4 mg   fentaNYL  (SUBLIMAZE ) injection 50 mcg    -I have reviewed the patients home medicines and have made adjustments as needed  Critical interventions Hand surgery consult, IV antibiotics  Consultations Obtained: I requested consultation with the Purchase with  orthopedic surgery,  and discussed lab and imaging findings as well as pertinent plan - they recommend: As above   Reevaluation: After the interventions noted above, I reevaluated the patient and found  that they have :stayed the same  Co morbidities that complicate the patient evaluation  Past Medical History:  Diagnosis Date   ADHD (attention deficit hyperactivity disorder)    Anxiety    Depression    Miscarriage    Pyelonephritis       Dispostion: Patient discussed with hospitalist will accept patient for admission  Final diagnoses:  Cat bite of left hand, initial encounter    ED Discharge Orders     None          Hildegard Loge, PA-C 08/22/24 1315  "

## 2024-08-22 NOTE — ED Notes (Signed)
 Order placed for x2 sets of blood cultures. Provider Susette Lash, PA-C aware antibiotics have already been started. Verified with provider ok to still collect cultures.

## 2024-08-22 NOTE — Anesthesia Preprocedure Evaluation (Addendum)
"                                    Anesthesia Evaluation  Patient identified by MRN, date of birth, ID band Patient awake    Reviewed: Allergy & Precautions, H&P , NPO status , Patient's Chart, lab work & pertinent test results  Airway Mallampati: II  TM Distance: >3 FB Neck ROM: Full    Dental no notable dental hx. (+) Teeth Intact, Dental Advisory Given   Pulmonary neg pulmonary ROS   Pulmonary exam normal breath sounds clear to auscultation       Cardiovascular negative cardio ROS  Rhythm:Regular Rate:Normal     Neuro/Psych   Anxiety Depression    negative neurological ROS     GI/Hepatic negative GI ROS, Neg liver ROS,,,  Endo/Other  negative endocrine ROS    Renal/GU negative Renal ROS  negative genitourinary   Musculoskeletal   Abdominal   Peds  Hematology negative hematology ROS (+)   Anesthesia Other Findings   Reproductive/Obstetrics negative OB ROS                              Anesthesia Physical Anesthesia Plan  ASA: 2  Anesthesia Plan: General   Post-op Pain Management: Ofirmev  IV (intra-op)* and Toradol  IV (intra-op)*   Induction: Intravenous  PONV Risk Score and Plan: 4 or greater and Ondansetron , Dexamethasone  and Midazolam   Airway Management Planned: LMA and Oral ETT  Additional Equipment:   Intra-op Plan:   Post-operative Plan: Extubation in OR  Informed Consent: I have reviewed the patients History and Physical, chart, labs and discussed the procedure including the risks, benefits and alternatives for the proposed anesthesia with the patient or authorized representative who has indicated his/her understanding and acceptance.     Dental advisory given  Plan Discussed with: CRNA  Anesthesia Plan Comments:          Anesthesia Quick Evaluation  "

## 2024-08-22 NOTE — Anesthesia Procedure Notes (Signed)
 Procedure Name: LMA Insertion Date/Time: 08/22/2024 9:06 PM  Performed by: Roddie Grate, CRNAPre-anesthesia Checklist: Patient identified, Emergency Drugs available, Suction available and Patient being monitored Patient Re-evaluated:Patient Re-evaluated prior to induction Oxygen Delivery Method: Circle System Utilized Preoxygenation: Pre-oxygenation with 100% oxygen Induction Type: IV induction Ventilation: Mask ventilation without difficulty LMA: LMA inserted LMA Size: 4.0 Number of attempts: 1 Airway Equipment and Method: Bite block Placement Confirmation: positive ETCO2 Tube secured with: Tape Dental Injury: Teeth and Oropharynx as per pre-operative assessment

## 2024-08-22 NOTE — Consult Note (Signed)
 Reason for Consult:Left hand cellulitis Referring Physician: Donalda Applebaum Time called: 1506 Time at bedside: 1509   Amy Lutz is an 34 y.o. female.  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 She quickly began to have swelling, pain, and redness. She was given doxy but worsened overnight. She presented to a satellite ED and hand surgery was consulted. She was admitted and transferred to [facility] for further treatment and evaluation. She is RHD.  Past Medical History:  Diagnosis Date   ADHD (attention deficit hyperactivity disorder)    Anxiety    Depression    Miscarriage    Pyelonephritis     Past Surgical History:  Procedure Laterality Date   CHOLECYSTECTOMY     KNEE ARTHROSCOPY     OVARIAN CYST SURGERY     WISDOM TOOTH EXTRACTION      History reviewed. No pertinent family history.  Social History:  reports that she has never smoked. She has never used smokeless tobacco. She reports current alcohol use. She reports current drug use.  Allergies: Allergies[1]  Medications: I have reviewed the patient's current medications.  Results for orders placed or performed during the hospital encounter of 08/22/24 (from the past 48 hours)  CBC     Status: None   Collection Time: 08/22/24 11:15 AM  Result Value Ref Range   WBC 8.2 4.0 - 10.5 K/uL   RBC 4.90 3.87 - 5.11 MIL/uL   Hemoglobin 14.0 12.0 - 15.0 g/dL   HCT 57.5 63.9 - 53.9 %   MCV 86.5 80.0 - 100.0 fL   MCH 28.6 26.0 - 34.0 pg   MCHC 33.0 30.0 - 36.0 g/dL   RDW 87.0 88.4 - 84.4 %   Platelets 310 150 - 400 K/uL   nRBC 0.0 0.0 - 0.2 %    Comment: Performed at [facility], [address] [place], [place] [address]  Basic metabolic panel     Status: Abnormal   Collection Time: 08/22/24 11:15 AM  Result Value Ref Range   Sodium 138 135 - 145 mmol/L   Potassium 3.6 3.5 - 5.1 mmol/L   Chloride 102 98 - 111 mmol/L   CO2 25 22 - 32 mmol/L   Glucose, Bld 134 (H) 70 - 99 mg/dL     Comment: Glucose reference range applies only to samples taken after fasting for at least 8 hours.   BUN 10 6 - 20 mg/dL   Creatinine, Ser 9.33 0.44 - 1.00 mg/dL   Calcium 9.3 8.9 - 89.6 mg/dL   GFR, Estimated >39 >39 mL/min    Comment: (NOTE) Calculated using the CKD-EPI Creatinine Equation (2021)    Anion gap 12 5 - 15    Comment: Performed at [facility], [address] [place], [place] [address]  hCG, quantitative, pregnancy     Status: None   Collection Time: 08/22/24  1:26 PM  Result Value Ref Range   hCG, Beta Chain, Quant, S <1 <5 mIU/mL    Comment:          GEST. AGE      CONC.  (mIU/mL)   <=1 WEEK        5 - 50     2 WEEKS       50 - 500     3 WEEKS       100 - 10,000     4 WEEKS     1,000 -  30,000     5 WEEKS     3,500 - 115,000   6-8 WEEKS     12,000 - 270,000    12 WEEKS     15,000 - 220,000        FEMALE AND NON-PREGNANT FEMALE:     LESS THAN 5 mIU/mL Performed at [facility], [address] [place], [place] [address]     DG Wrist Complete Left Result Date: 08/22/2024 CLINICAL DATA:  Multiple cat bites EXAM: LEFT WRIST - COMPLETE 4 VIEW COMPARISON:  None Available. FINDINGS: There is no evidence of fracture or dislocation. There is no evidence of arthropathy or other focal bone abnormality. Soft tissue thickening overlying the dorsal distal forearm. No radiopaque foreign body. IMPRESSION: Soft tissue thickening overlying the dorsal distal forearm. No radiopaque foreign body. No acute osseous abnormality. Electronically Signed   By: Limin  Xu M.D.   On: 08/22/2024 12:13    Review of Systems  Constitutional:  Negative for chills, diaphoresis and fever.  HENT:  Negative for ear discharge, ear pain, hearing loss and tinnitus.   Eyes:  Negative for photophobia and pain.  Respiratory:  Negative for cough and shortness of breath.   Cardiovascular:  Negative for chest pain.  Gastrointestinal:  Negative for abdominal pain, nausea and vomiting.   Genitourinary:  Negative for dysuria, flank pain, frequency and urgency.  Musculoskeletal:  Positive for arthralgias (Left hand/FA pain). Negative for back pain, myalgias and neck pain.  Neurological:  Negative for dizziness and headaches.  Hematological:  Does not bruise/bleed easily.  Psychiatric/Behavioral:  The patient is not nervous/anxious.    Blood pressure (!) 128/58, pulse 78, temperature 97.7 F (36.5 C), temperature source Oral, resp. rate 16, last menstrual period 08/05/2024, SpO2 97%, currently breastfeeding. Physical Exam Constitutional:      General: She is not in acute distress.    Appearance: She is well-developed. She is not diaphoretic.  HENT:     Head: Normocephalic and atraumatic.  Eyes:     General: No scleral icterus.       Right eye: No discharge.        Left eye: No discharge.     Conjunctiva/sclera: Conjunctivae normal.  Cardiovascular:     Rate and Rhythm: Normal rate and regular rhythm.  Pulmonary:     Effort: Pulmonary effort is normal. No respiratory distress.  Musculoskeletal:     Cervical back: Normal range of motion.     Comments: Left shoulder, elbow, wrist, digits- Scattered puncture wounds thenar and distal FA, mod diffuse TTP, no fluctuance, no e/o septic wrist or CMC joints, erythema and edema to thenar with streaking onto FA, no instability, no blocks to motion  Sens  Ax/R/U intact, M paresthetic  Mot   Ax/ R/ PIN/ M/ AIN/ U intact  Rad 2+  Skin:    General: Skin is warm and dry.  Neurological:     Mental Status: She is alert.  Psychiatric:        Mood and Affect: Mood normal.        Behavior: Behavior normal.     Assessment/Plan: Left hand cellulitis -- No urgent indication for I&D though still may benefit from same. Dr. Arlinda to evaluate shortly and decide on I&D tonight vs continued IV abx and reassessment in the AM. Please keep NPO for now.    Amy DOROTHA Ned, PA-C Orthopedic Surgery [phone] 08/22/2024, 3:18 PM      [1]  Allergies Allergen Reactions  Amoxicillin  Hives and Shortness Of Breath    Patient had a cat bite.  She was taking doxycycline which was a stranger's for Bite.  I question if she had any antibiotic allergies and she told me she has allergy to amoxicillin .  When questioned what symptoms she gets from that she states she gets hives and difficulty breathing.   Iodinated Contrast Media Anaphylaxis   Morphine And Codeine Other (See Comments)    Increases pain   Strawberry Extract Hives   Sulfa Antibiotics Rash   Sulfamethoxazole-Trimethoprim Rash

## 2024-08-22 NOTE — Op Note (Signed)
 NAME: Amy Lutz MEDICAL RECORD NO: [id number] DATE OF BIRTH: 08/11/1990 FACILITY: [facility] LOCATION: [facility] OR PHYSICIAN: GILDARDO ALDERTON, MD   OPERATIVE REPORT   DATE OF PROCEDURE: 08/22/24    PREOPERATIVE DIAGNOSIS: Left hand infection   POSTOPERATIVE DIAGNOSIS: Left hand infection   PROCEDURE: Left hand incision and drainage, deep thenar space of the thumb and dorsal hand   SURGEON:  Gildardo Alderton, M.D.   ASSISTANT: Joesph Hooks, OPA   ANESTHESIA:  General   INTRAVENOUS FLUIDS:  Per anesthesia flow sheet.   ESTIMATED BLOOD LOSS:  Minimal.   COMPLICATIONS:  None.   SPECIMENS: Culture sent from the murky fluid within the palmar aspect of the hand   TOURNIQUET TIME:    Total Tourniquet Time Documented: Upper Arm (Left) - 5 minutes Total: Upper Arm (Left) - 5 minutes    DISPOSITION:  Stable to PACU.   INDICATIONS: 34 year old female who is seen after a cat bite sustained to the left hand 1 day prior.  Patient developed subsequent erythema and pain throughout the thenar aspect of the hand and dorsal aspect of the thumb.  Patient was admitted to the medical service for IV antibiotics, given her significant pain and concern for potential developing abscess, patient was indicated for left hand incision and drainage.  Risks and benefits of surgery were discussed including the risks of persistent infection, bleeding, scarring, stiffness, nerve injury, vascular injury, tendon injury, need for subsequent operation, , need for repeat irrigation and debridement.  She voiced understanding of these risks and elected to proceed.  OPERATIVE COURSE: Patient was seen and identified in the preoperative area and marked appropriately.  Surgical consent had been signed. Antibiotics were held for intraoperative cultures. She was transferred to the operating room and placed in supine position with the Left upper extremity on an arm board.  General anesthesia was induced by the anesthesiologist.   Left upper extremity was prepped and draped in normal sterile orthopedic fashion.  A surgical pause was performed between the surgeons, anesthesia, and operating room staff and all were in agreement as to the patient, procedure, and site of procedure.  Tourniquet was placed and padded appropriately to the left upper arm.  The arm was exsanguinated and the tourniquet was inflated to 250 mmHg.  We first began with a longitudinal incision of the thenar aspect of the left palm.  Incision was carried down utilizing a 15 blade, skin edges were elevated to create flaps, blunt dissection was performed down to debulk notable fluid within the thenar aspect of the hand.  Minimal purulence was encountered.  Additional incision was then made over the dorsal aspect of the thumb near an additional bite wound.  Once again skin flaps were elevated to allow for blunt dissection.  Freer elevator was then introduced to communicate between the thenar aspect and the dorsal aspect of the hand to ensure no loculated fluid collection.  Once again, slightly murky fluid was debulked from the dorsal aspect of the hand without significant purulence.  Cultures were then taken.  At this juncture, the tourniquet was deflated and bipolar electrocautery was utilized for hemostasis.  There was no significant fluid collection remaining throughout the hand or thenar aspect.  Copious irrigation was performed utilizing 3 L of normal saline via cystoscopy tubing.  Closure was then performed utilizing 4-0 nylon in simple standard fashion both incisional sites.  Sterile dressings were applied followed by application of a soft hand wrap.  Post-operative plan: The patient will recover in the  post-anesthesia care unit and then be admitted under the medical service for broad-spectrum antibiotics.  Cultures will be followed.  Pending medical improvement, she can be discharged per the medical team.  There is no plan for repeat irrigation and debridement  at this time.  The patient will be non weight bearing on the left upper extremity in a soft dressing.    Amy Martinezgarcia, MD Electronically signed, 08/22/24

## 2024-08-22 NOTE — Transfer of Care (Signed)
 Immediate Anesthesia Transfer of Care Note  Patient: Amy Lutz  Procedure(s) Performed: IRRIGATION AND DEBRIDEMENT WOUND (Left)  Patient Location: PACU  Anesthesia Type:General  Level of Consciousness: drowsy  Airway & Oxygen Therapy: Patient Spontanous Breathing and Patient connected to nasal cannula oxygen  Post-op Assessment: Report given to RN and Post -op Vital signs reviewed and stable  Post vital signs: Reviewed and stable  Last Vitals:  Vitals Value Taken Time  BP 118/58 08/22/24 21:51  Temp 36.8 C 08/22/24 21:52  Pulse 63 08/22/24 21:55  Resp 14 08/22/24 21:55  SpO2 100 % 08/22/24 21:55  Vitals shown include unfiled device data.  Last Pain:  Vitals:   08/22/24 1700  TempSrc: Oral  PainSc:          Complications: No notable events documented.

## 2024-08-22 NOTE — Discharge Instructions (Signed)

## 2024-08-22 NOTE — Anesthesia Postprocedure Evaluation (Signed)
"   Anesthesia Post Note  Patient: Amy Lutz  Procedure(s) Performed: IRRIGATION AND DEBRIDEMENT WOUND (Left)     Patient location during evaluation: PACU Anesthesia Type: General Level of consciousness: awake and alert Pain management: pain level controlled Vital Signs Assessment: post-procedure vital signs reviewed and stable Respiratory status: spontaneous breathing, nonlabored ventilation and respiratory function stable Cardiovascular status: blood pressure returned to baseline and stable Postop Assessment: no apparent nausea or vomiting Anesthetic complications: no   No notable events documented.  Last Vitals:  Vitals:   08/22/24 2245 08/22/24 2310  BP: 109/61 111/74  Pulse: 71 69  Resp: 12   Temp: 36.8 C 36.7 C  SpO2: 96% 96%    Last Pain:  Vitals:   08/22/24 2310  TempSrc: Oral  PainSc:                  Zoei Amison,W. EDMOND      "
